# Patient Record
Sex: Female | Born: 1955
Health system: Southern US, Community
[De-identification: ages and names within clinical notes are randomized; demographics above are authoritative.]

## PROBLEM LIST (undated history)

## (undated) DIAGNOSIS — I10 Essential (primary) hypertension: Secondary | ICD-10-CM

## (undated) DIAGNOSIS — B019 Varicella without complication: Secondary | ICD-10-CM

## (undated) HISTORY — DX: Essential (primary) hypertension: I10

## (undated) HISTORY — PX: OTHER SURGICAL HISTORY: SHX169

## (undated) HISTORY — DX: Varicella without complication: B01.9

---

## 1997-11-01 ENCOUNTER — Other Ambulatory Visit: Admission: RE | Admit: 1997-11-01 | Discharge: 1997-11-01 | Payer: Self-pay | Admitting: Obstetrics and Gynecology

## 1997-11-09 ENCOUNTER — Observation Stay (HOSPITAL_COMMUNITY): Admission: RE | Admit: 1997-11-09 | Discharge: 1997-11-10 | Payer: Self-pay | Admitting: Obstetrics & Gynecology

## 1998-02-23 ENCOUNTER — Ambulatory Visit (HOSPITAL_COMMUNITY): Admission: RE | Admit: 1998-02-23 | Discharge: 1998-02-23 | Payer: Self-pay | Admitting: Obstetrics and Gynecology

## 1998-02-25 ENCOUNTER — Ambulatory Visit (HOSPITAL_COMMUNITY): Admission: RE | Admit: 1998-02-25 | Discharge: 1998-02-25 | Payer: Self-pay | Admitting: Obstetrics and Gynecology

## 1998-03-01 ENCOUNTER — Encounter: Payer: Self-pay | Admitting: Obstetrics

## 1998-03-02 ENCOUNTER — Inpatient Hospital Stay (HOSPITAL_COMMUNITY): Admission: AD | Admit: 1998-03-02 | Discharge: 1998-03-03 | Payer: Self-pay | Admitting: Obstetrics

## 1999-10-15 ENCOUNTER — Other Ambulatory Visit: Admission: RE | Admit: 1999-10-15 | Discharge: 1999-10-15 | Payer: Self-pay | Admitting: Obstetrics and Gynecology

## 1999-11-02 ENCOUNTER — Encounter: Payer: Self-pay | Admitting: Obstetrics and Gynecology

## 1999-11-02 ENCOUNTER — Ambulatory Visit (HOSPITAL_COMMUNITY): Admission: RE | Admit: 1999-11-02 | Discharge: 1999-11-02 | Payer: Self-pay | Admitting: Obstetrics and Gynecology

## 2000-11-24 ENCOUNTER — Other Ambulatory Visit: Admission: RE | Admit: 2000-11-24 | Discharge: 2000-11-24 | Payer: Self-pay | Admitting: Internal Medicine

## 2001-09-28 ENCOUNTER — Other Ambulatory Visit: Admission: RE | Admit: 2001-09-28 | Discharge: 2001-09-28 | Payer: Self-pay | Admitting: Obstetrics and Gynecology

## 2002-02-12 ENCOUNTER — Ambulatory Visit (HOSPITAL_COMMUNITY): Admission: RE | Admit: 2002-02-12 | Discharge: 2002-02-12 | Payer: Self-pay | Admitting: Obstetrics and Gynecology

## 2002-02-12 ENCOUNTER — Encounter: Payer: Self-pay | Admitting: Obstetrics and Gynecology

## 2002-03-02 ENCOUNTER — Other Ambulatory Visit: Admission: RE | Admit: 2002-03-02 | Discharge: 2002-03-02 | Payer: Self-pay | Admitting: Internal Medicine

## 2004-05-09 ENCOUNTER — Ambulatory Visit (HOSPITAL_COMMUNITY): Admission: RE | Admit: 2004-05-09 | Discharge: 2004-05-09 | Payer: Self-pay | Admitting: Obstetrics and Gynecology

## 2006-02-24 ENCOUNTER — Ambulatory Visit (HOSPITAL_COMMUNITY): Admission: RE | Admit: 2006-02-24 | Discharge: 2006-02-24 | Payer: Self-pay | Admitting: Obstetrics and Gynecology

## 2008-07-04 ENCOUNTER — Telehealth: Payer: Self-pay | Admitting: Internal Medicine

## 2008-08-26 ENCOUNTER — Ambulatory Visit (HOSPITAL_COMMUNITY): Admission: RE | Admit: 2008-08-26 | Discharge: 2008-08-26 | Payer: Self-pay | Admitting: Obstetrics and Gynecology

## 2008-09-01 ENCOUNTER — Encounter: Admission: RE | Admit: 2008-09-01 | Discharge: 2008-09-01 | Payer: Self-pay | Admitting: Obstetrics and Gynecology

## 2008-09-15 ENCOUNTER — Encounter: Payer: Self-pay | Admitting: Internal Medicine

## 2008-09-26 ENCOUNTER — Ambulatory Visit: Payer: Self-pay | Admitting: Internal Medicine

## 2008-09-26 DIAGNOSIS — R030 Elevated blood-pressure reading, without diagnosis of hypertension: Secondary | ICD-10-CM | POA: Insufficient documentation

## 2008-09-26 DIAGNOSIS — E669 Obesity, unspecified: Secondary | ICD-10-CM | POA: Insufficient documentation

## 2008-09-26 LAB — CONVERTED CEMR LAB: Pap Smear: NORMAL

## 2008-09-26 LAB — HM PAP SMEAR

## 2008-11-09 ENCOUNTER — Encounter (INDEPENDENT_AMBULATORY_CARE_PROVIDER_SITE_OTHER): Payer: Self-pay | Admitting: *Deleted

## 2009-06-13 LAB — HM COLONOSCOPY

## 2009-11-27 ENCOUNTER — Encounter: Admission: RE | Admit: 2009-11-27 | Discharge: 2009-11-27 | Payer: Self-pay | Admitting: Obstetrics and Gynecology

## 2009-11-27 LAB — HM MAMMOGRAPHY

## 2010-02-08 NOTE — Letter (Signed)
Summary: [facility]  [facility]   Imported By: Maryln Gottron 10/13/2008 15:25:38  _____________________________________________________________________  External Attachment:    Type:   Image     Comment:   External Document

## 2010-02-08 NOTE — Procedures (Signed)
Summary: [facility]  [facility] Provider: This provider was preselected by the workflow.  Signature: The signature status of this document was preset by the workflow  Processed by [employer] Local Indexer Client @ Friday, November 11, 2008 2:19:10 PM using version:2010.1.2.11(2.4)   Manually Indexed By: [id number]  idlBatchDetail: [id number]   _____________________________________________________________________  External Attachment:    Type:   Image     Comment:   External Document

## 2010-02-08 NOTE — Assessment & Plan Note (Signed)
Summary: NEW PATIENT/TO BE RESTABLISHED/CJR   Vital Signs:  Patient profile:   55 year old female Menstrual status:  irregular LMP:     09/10/2008 Height:      64.25 inches Weight:      187 pounds BMI:     31.96 Pulse rate:   72 / minute BP sitting:   120 / 80  (left arm) Cuff size:   regular  Vitals Entered By: Romualdo Bolk, CMA (AAMA) (September 26, 2008 1:57 PM) CC: Pt her to get re-establish- Pt wants to discuss going thru menopause. Pt is getting hot flashes that comes and goes. LMP (date): 09/10/2008 LMP - Character: light Menarche (age onset years): 19   Menses interval (days): 30 Menstrual flow (days): 5 Menstrual Status irregular Enter LMP: 09/10/2008 Last PAP Date 07/14/2008 Last PAP Result Normal   History of Present Illness: Mekisha Bittel comes in today for   for above. She is reestablishing with a PCP. Has some concerns about her health. Last visit here was 5-6 years ago. Her old record NA at time of visit .  She has mostly been followed by her GYne .  SHe is concerned about ability to maintain healthy weight.  Recently   placed on pheneteramine  for weight.   control.   and also doing   B 12 shots.  to help her diet regimen  per her GYNE office . Marland Kitchen  Bp had been up and now med  is decreased to 1/2 dose.     Blood  tests at Dr Trilby Drummer said it was normal.    To get a colonscopy in November .    Skipped July  period.   minimal    sred spotting.  No major sleep change .   Preventive Care Screening  Pap Smear:    Date:  09/26/2008    Results:  Normal   Preventive Screening-Counseling & Management  Alcohol-Tobacco     Alcohol drinks/day: 0     Smoking Status: quit     Year Quit: age 89's  Caffeine-Diet-Exercise     Caffeine use/day: 2     Does Patient Exercise: yes     Type of exercise: walking      Drug Use:  no.    Current Medications (verified): 1)  Cobal-1000 1000 Mcg/ml Soln (Cyanocobalamin) 2)  Phentermine Hcl 37.5 Mg Tabs (Phentermine Hcl)  .Marland Kitchen.. 1 By Mouth Once Daily  Allergies (verified): 1)  ! Penicillin  Past History:  Past Medical History: Unremarkable Chickenpox as a child  P4G1  tubal x 2   Past Surgical History: Miscarrages [phone] (tubal pg) Rt Tube and ovary  Remove  tubal pregnancy 1999 Bx Rt Breast Benign fibroadenoma  Past History:  Care Management: Gynecology: Bridget Hartshorn.   Gastroenterology: Loreta Ave  Family History: Father: DM Mother: Bone Cancer, HBP, DM Siblings: Sister- Gout, Brother-Healthy  Social History: Married self employed  Actuary degree Former Smoker Alcohol use-no Drug use-no Regular exercise-yes HH of 2  no pets  Smoking Status:  quit Caffeine use/day:  2 Does Patient Exercise:  yes Drug Use:  no  Review of Systems  The patient denies anorexia, fever, weight loss, vision loss, decreased hearing, hoarseness, chest pain, syncope, dyspnea on exertion, peripheral edema, prolonged cough, headaches, hemoptysis, abdominal pain, melena, hematochezia, severe indigestion/heartburn, hematuria, incontinence, muscle weakness, transient blindness, difficulty walking, depression, unusual weight change, abnormal bleeding, enlarged lymph nodes, angioedema, and breast masses.    Physical Exam  General:  Well-developed,well-nourished,in no acute distress; alert,appropriate and cooperative throughout examination Head:  Normocephalic and atraumatic without obvious abnormalities. No apparent alopecia or balding. Eyes:  PERRL, EOMs full, conjunctiva clear  Ears:  R ear normal, L ear normal, and no external deformities.   Nose:  no external deformity and no external erythema.   Mouth:  pharynx pink and moist.   Neck:  No deformities, masses, or tenderness noted. Lungs:  Normal respiratory effort, chest expands symmetrically. Lungs are clear to auscultation, no crackles or wheezes.no dullness.   Heart:  Normal rate and regular rhythm. S1 and S2 normal without gallop, murmur,  click, rub or other extra sounds.no lifts.   Abdomen:  Bowel sounds positive,abdomen soft and non-tender without masses, organomegaly or hernias noted. Msk:  normal ROM, no joint swelling, no joint warmth, and no redness over joints.   Pulses:  pulses intact without delay   Extremities:  No clubbing, cyanosis, edema, or deformity noted with normal full range of motion.  Neurologic:  alert & oriented X3, cranial nerves II-XII intact, strength normal in all extremities, gait normal, and DTRs symmetrical and normal.   Skin:  turgor normal, no suspicious lesions, no petechiae, no purpura, and no ulcerations.   Cervical Nodes:  No lymphadenopathy noted Psych:  Oriented X3, memory intact for recent and remote, normally interactive, good eye contact, not anxious appearing, and not depressed appearing.     Impression & Recommendations:  Problem # 1:  ? of MENOPAUSE, EARLY (ICD-627.2) WNL by hx .    Problem # 2:  OBESITY (ICD-278.00)  counseled   success rate with med alone is dismal .  reviewed confounding factors such as age sleep etc. Unclear why she is on B12 shots without a hx of b12 dificiency   by her report.   Ht: 64.25 (09/26/2008)   Wt: 187 (09/26/2008)   BMI: 31.96 (09/26/2008)  Problem # 3:  ELEVATED BLOOD PRESSURE WITHOUT DIAGNOSIS OF HYPERTENSION (ICD-796.2) seems to be from her diet medication. ok today .  needs to monitor .  Complete Medication List: 1)  Cobal-1000 1000 Mcg/ml Soln (Cyanocobalamin) 2)  Phentermine Hcl 37.5 Mg Tabs (Phentermine hcl) .Marland Kitchen.. 1 by mouth once daily  Other Orders: Tdap => 79yrs IM (24401) Admin 1st Vaccine (02725)  Patient Instructions: 1)  sign a release to get Labs from Dr Trilby Drummer office.  2)  Then we will decide on further testing. 3)  I think your signs are related to early menopause .   4)  Get at least 8 hours of sleep in a row at night .  5)  Dont skip meals . 6)  Get a Blood presssure  machine  for use over the years to check you Bp  readings  . below  140/90 is the goal and 120 /80 is better.  greater than 50% of visit spent in counseling  and further record review   45 minutes .  Immunizations Administered:  Tetanus Vaccine:    Vaccine Type: Tdap    Site: right deltoid    Mfr: Sanofi Pasteur    Dose: 0.5 ml    Route: IM    Given by: Romualdo Bolk, CMA (AAMA)    Exp. Date: 07/14/2010    Lot #: [id number]   Appended Document: NEW PATIENT/TO BE RESTABLISHED/CJR Tell patient that the only  lab report  i have recieved from Dr Loreta Ave is normal .    However would like her  to have  fasting lipids, tsh with free  t4  , vitamin d  and vit b12 level done if not done elsewhere.then return office visit to discuss results .   Appended Document: NEW PATIENT/TO BE RESTABLISHED/CJR Pt aware and will call back to schedule lab work

## 2010-02-08 NOTE — Progress Notes (Signed)
Summary: Pt called and said she is Dr. Fabian Sharp pt. Chart must be archived.  Phone Note Call from Patient Call back at Home Phone [phone]   Caller: Patient Call For: Morgan Maldonado Summary of Call: Patient called and wanted to set up an appt with Dr. Fabian Sharp, but according to IDX and EMR she hasnt had any appts. I told patient that she would have to be sceduled as a new patient. The first avail new patient appt is 10/04/08 at 10am, so I scheduled her for then. Patient says she knows it hasnt been that long since she has seen Dr. Fabian Sharp and would like to get in for an earlier appt. Pt mentioned she would like to have a physical. Please advise.  Initial call taken by: Lucy Antigua,  July 04, 2008 1:11 PM  Follow-up for Phone Call        Last seen for a cpx according to Medical Manager was 03/02/2002. Follow-up by: Romualdo Bolk, CMA,  July 04, 2008 1:51 PM  Additional Follow-up for Phone Call Additional follow up Details #1::        I called patient and told her, since she was last seen in 2004, that she would have to come in for new pt appt. She is sceduled for September. Pt has been notified.  Additional Follow-up by: Lucy Antigua,  July 05, 2008 12:19 PM

## 2011-02-18 ENCOUNTER — Other Ambulatory Visit: Payer: Self-pay | Admitting: Obstetrics and Gynecology

## 2011-02-18 DIAGNOSIS — Z1231 Encounter for screening mammogram for malignant neoplasm of breast: Secondary | ICD-10-CM

## 2011-02-25 ENCOUNTER — Ambulatory Visit
Admission: RE | Admit: 2011-02-25 | Discharge: 2011-02-25 | Disposition: A | Discharge status: Home / Self Care | Payer: 59 | Source: Ambulatory Visit | Attending: Obstetrics and Gynecology | Admitting: Obstetrics and Gynecology

## 2011-02-25 DIAGNOSIS — Z1231 Encounter for screening mammogram for malignant neoplasm of breast: Secondary | ICD-10-CM

## 2011-06-13 ENCOUNTER — Encounter: Payer: Self-pay | Admitting: Internal Medicine

## 2011-06-14 ENCOUNTER — Encounter: Payer: Self-pay | Admitting: Internal Medicine

## 2011-06-14 ENCOUNTER — Ambulatory Visit (INDEPENDENT_AMBULATORY_CARE_PROVIDER_SITE_OTHER): Payer: 59 | Admitting: Internal Medicine

## 2011-06-14 VITALS — BP 164/92 | HR 75 | Temp 97.6°F | Ht 63.75 in | Wt 190.0 lb

## 2011-06-14 DIAGNOSIS — Z0000 Encounter for general adult medical examination without abnormal findings: Secondary | ICD-10-CM

## 2011-06-14 DIAGNOSIS — E669 Obesity, unspecified: Secondary | ICD-10-CM

## 2011-06-14 DIAGNOSIS — I10 Essential (primary) hypertension: Secondary | ICD-10-CM

## 2011-06-14 LAB — CBC WITH DIFFERENTIAL/PLATELET
Basophils Absolute: 0 10*3/uL (ref 0.0–0.1)
Basophils Relative: 0.6 % (ref 0.0–3.0)
Eosinophils Absolute: 0.1 10*3/uL (ref 0.0–0.7)
Eosinophils Relative: 1.4 % (ref 0.0–5.0)
HCT: 38.5 % (ref 36.0–46.0)
Hemoglobin: 12.6 g/dL (ref 12.0–15.0)
Lymphocytes Relative: 51.1 % — ABNORMAL HIGH (ref 12.0–46.0)
Lymphs Abs: 2.1 10*3/uL (ref 0.7–4.0)
MCHC: 32.8 g/dL (ref 30.0–36.0)
MCV: 79.1 fl (ref 78.0–100.0)
Monocytes Absolute: 0.5 10*3/uL (ref 0.1–1.0)
Monocytes Relative: 10.9 % (ref 3.0–12.0)
Neutro Abs: 1.5 10*3/uL (ref 1.4–7.7)
Neutrophils Relative %: 36 % — ABNORMAL LOW (ref 43.0–77.0)
Platelets: 232 10*3/uL (ref 150.0–400.0)
RBC: 4.86 Mil/uL (ref 3.87–5.11)
RDW: 13.1 % (ref 11.5–14.6)
WBC: 4.2 10*3/uL — ABNORMAL LOW (ref 4.5–10.5)

## 2011-06-14 LAB — LDL CHOLESTEROL, DIRECT: Direct LDL: 122.7 mg/dL

## 2011-06-14 LAB — POCT URINALYSIS DIPSTICK
Bilirubin, UA: NEGATIVE
Blood, UA: NEGATIVE
Glucose, UA: NEGATIVE
Ketones, UA: NEGATIVE
Leukocytes, UA: NEGATIVE
Nitrite, UA: NEGATIVE
Protein, UA: NEGATIVE
Spec Grav, UA: <=1.005
Urobilinogen, UA: 0.2
pH, UA: 6.5

## 2011-06-14 LAB — LIPID PANEL
Cholesterol: 209 mg/dL — ABNORMAL HIGH (ref 0–200)
HDL: 43.5 mg/dL (ref >39.00)
Total CHOL/HDL Ratio: 5
Triglycerides: 164 mg/dL — ABNORMAL HIGH (ref 0.0–149.0)
VLDL: 32.8 mg/dL (ref 0.0–40.0)

## 2011-06-14 LAB — HEPATIC FUNCTION PANEL
ALT: 23 U/L (ref 0–35)
AST: 19 U/L (ref 0–37)
Albumin: 3.9 g/dL (ref 3.5–5.2)
Alkaline Phosphatase: 99 U/L (ref 39–117)
Bilirubin, Direct: 0 mg/dL (ref 0.0–0.3)
Total Bilirubin: 0.2 mg/dL — ABNORMAL LOW (ref 0.3–1.2)
Total Protein: 7.7 g/dL (ref 6.0–8.3)

## 2011-06-14 LAB — BASIC METABOLIC PANEL
BUN: 11 mg/dL (ref 6–23)
CO2: 29 mEq/L (ref 19–32)
Calcium: 9.1 mg/dL (ref 8.4–10.5)
Chloride: 105 mEq/L (ref 96–112)
Creatinine, Ser: 0.8 mg/dL (ref 0.4–1.2)
GFR: 98.83 mL/min (ref >60.00)
Glucose, Bld: 81 mg/dL (ref 70–99)
Potassium: 4.3 mEq/L (ref 3.5–5.1)
Sodium: 139 mEq/L (ref 135–145)

## 2011-06-14 LAB — TSH: TSH: 1.75 u[IU]/mL (ref 0.35–5.50)

## 2011-06-14 MED ORDER — AMLODIPINE BESY-BENAZEPRIL HCL 5-10 MG PO CAPS: 1.0000 | ORAL_CAPSULE | Freq: Every day | ORAL | Status: DC

## 2011-06-14 NOTE — Progress Notes (Signed)
Subjective:    Patient ID: Morgan Maldonado, female    DOB: Feb 28, 1955, 56 y.o.   MRN: [id number]  HPI Patient comes in today for preventive visit and follow-up of medical issues. Update  history since  last visit: almost 3 years ago : no injury or major changes  BP readings at her ob gyne "a bit high ." no cp sob sleep irreg ? Menopause .  Hard to lose weight Family hx  No HT?  Sleep 4-6 hours    Colon 2011 Vision reading glasses.  She is on no meds now Review of Systems ROS:  GEN/ HEENT: No fever, significant weight changes sweats headaches vision problems hearing changes, CV/ PULM; No chest pain shortness of breath cough, syncope,edema  change in exercise tolerance. GI /GU: No adominal pain, vomiting, change in bowel habits. No blood in the stool. No significant GU symptoms. SKIN/HEME: ,no acute skin rashes suspicious lesions or bleeding. No lymphadenopathy, nodules, masses.  NEURO/ PSYCH:  No neurologic signs such as weakness numbness. No depression anxiety. IMM/ Allergy: No unusual infections.  Allergy .   REST of 12 system review negative except as per HPI knees bother her sometimes   Past history family history social history reviewed in the electronic medical record. On no meds      Objective:   Physical Exam BP 164/92  Pulse 75  Temp(Src) 97.6 F (36.4 C) (Oral)  Ht 5' 3.75" (1.619 m)  Wt 190 lb (86.183 kg)  BMI 32.87 kg/m2  SpO2 96%  Wt Readings from Last 3 Encounters:  06/14/11 190 lb (86.183 kg)  09/26/08 187 lb (84.823 kg)  Physical Exam: Vital signs reviewed repeat BP readings 158/98 range right and left  Reg cuff  JYN:WGNF is a well-developed well-nourished alert cooperative  AA female who appears her stated age in no acute distress.  HEENT: normocephalic atraumatic , Eyes: PERRL EOM's full, conjunctiva clear, Nares: paten,t no deformity discharge or tenderness., Ears: no deformity EAC's clear TMs with normal landmarks. Mouth: clear OP, no lesions, edema.  Moist  mucous membranes. Dentition in adequate repair. NECK: supple without masses, thyromegaly or bruits. CHEST/PULM:  Clear to auscultation and percussion breath sounds equal no wheeze , rales or rhonchi. No chest wall deformities or tenderness. Breast: normal by inspection . No dimpling, discharge, masses, tenderness or discharge . CV: PMI is nondisplaced, S1 S2 no gallops, murmurs, rubs. Peripheral pulses are full without delay.No JVD .  ABDOMEN: Bowel sounds normal nontender  No guard or rebound, no hepato splenomegal no CVA tenderness.  No hernia. Extremtities:  No clubbing cyanosis or edema, no acute joint swelling or redness no focal atrophy NEURO:  Oriented x3, cranial nerves 3-12 appear to be intact, no obvious focal weakness,gait within normal limits no abnormal reflexes or asymmetrical SKIN: No acute rashes normal turgor, color, no bruising or petechiae. PSYCH: Oriented, good eye contact, no obvious depression anxiety, cognition and judgment appear normal. LN: no cervical axillary inguinal adenopathy     Assessment & Plan:  Preventive Health Care Counseled regarding healthy nutrition, exercise, sleep, injury prevention, calcium vit d and healthy weight . Lab today utd on colon  2011  Get dentist and eye check   Sees gyne for pap. UTD   Elevated Bp probable hypertension.  Get machine and monitor  If elevated x 3 begin medication  lotrel 5 10 .   Disc risk benenfit  l.  Fu in 1-2 months  labd pending today  ife style interventions  Counseled.  Obesity   Counseled. Monitoring and inc sleep

## 2011-06-14 NOTE — Patient Instructions (Addendum)
Your blood pressure is elevated \\get  a machine  Implement  lifestyle intervention healthy eating and exercise . Weight loss low salt and sodium DASH diet. If your readings at home are elevated x 3 then begin medication to treat in the meantime. ROV in 1-2 months with machine . And readings of bp. Calendar your sleep intake to help with weight control.   Hypertension Information As your heart beats, it forces blood through your arteries. This force is your blood pressure. If the pressure is too high, it is called hypertension (HTN) or high blood pressure. HTN is dangerous because you may have it and not know it. High blood pressure may mean that your heart has to work harder to pump blood. Your arteries may be narrow or stiff. The extra work puts you at risk for heart disease, stroke, and other problems.  Blood pressure consists of two numbers, a higher number over a lower, 110/72, for example. It is stated as "110 over 72." The ideal is below 120 for the top number (systolic) and under 80 for the bottom (diastolic).  You should pay close attention to your blood pressure if you have certain conditions such as:  Heart failure.   Prior heart attack.   Diabetes   Chronic kidney disease.   Prior stroke.   Multiple risk factors for heart disease.  To see if you have HTN, your blood pressure should be measured while you are seated with your arm held at the level of the heart. It should be measured at least twice. A one-time elevated blood pressure reading (especially in the Emergency Department) does not mean that you need treatment. There may be conditions in which the blood pressure is different between your right and left arms. It is important to see your caregiver soon for a recheck. Most people have essential hypertension which means that there is not a specific cause. This type of high blood pressure may be lowered by changing lifestyle factors such as:  Stress.   Smoking.   Lack of  exercise.   Excessive weight.   Drug/tobacco/alcohol use.   Eating less salt.  Most people do not have symptoms from high blood pressure until it has caused damage to the body. Effective treatment can often prevent, delay or reduce that damage. TREATMENT  Treatment for high blood pressure, when a cause has been identified, is directed at the cause. There are a large number of medications to treat HTN. These fall into several categories, and your caregiver will help you select the medicines that are best for you. Medications may have side effects. You should review side effects with your caregiver. If your blood pressure stays high after you have made lifestyle changes or started on medicines,   Your medication(s) may need to be changed.   Other problems may need to be addressed.   Be certain you understand your prescriptions, and know how and when to take your medicine.   Be sure to follow up with your caregiver within the time frame advised (usually within two weeks) to have your blood pressure rechecked and to review your medications.   If you are taking more than one medicine to lower your blood pressure, make sure you know how and at what times they should be taken. Taking two medicines at the same time can result in blood pressure that is too low.  Document Released: 02/26/2005 Document Revised: 09/05/2010 Document Reviewed: 03/05/2007 [facility] Patient Information 2012 [place], [place].DASH Diet The DASH diet stands for "  Dietary Approaches to Stop Hypertension." It is a healthy eating plan that has been shown to reduce high blood pressure (hypertension) in as little as 14 days, while also possibly providing other significant health benefits. These other health benefits include reducing the risk of breast cancer after menopause and reducing the risk of type 2 diabetes, heart disease, colon cancer, and stroke. Health benefits also include weight loss and slowing kidney failure in patients  with chronic kidney disease.  DIET GUIDELINES  Limit salt (sodium). Your diet should contain less than 1500 mg of sodium daily.   Limit refined or processed carbohydrates. Your diet should include mostly whole grains. Desserts and added sugars should be used sparingly.   Include small amounts of heart-healthy fats. These types of fats include nuts, oils, and tub margarine. Limit saturated and trans fats. These fats have been shown to be harmful in the body.  CHOOSING FOODS  The following food groups are based on a 2000 calorie diet. See your Registered Dietitian for individual calorie needs. Grains and Grain Products (6 to 8 servings daily)  Eat More Often: Whole-wheat bread, [place] rice, whole-grain or wheat pasta, quinoa, popcorn without added fat or salt (air popped).   Eat Less Often: White bread, white pasta, white rice, cornbread.  Vegetables (4 to 5 servings daily)  Eat More Often: Fresh, frozen, and canned vegetables. Vegetables may be raw, steamed, roasted, or grilled with a minimal amount of fat.   Eat Less Often/Avoid: Creamed or fried vegetables. Vegetables in a cheese sauce.  Fruit (4 to 5 servings daily)  Eat More Often: All fresh, canned (in natural juice), or frozen fruits. Dried fruits without added sugar. One hundred percent fruit juice ( cup [237 mL] daily).   Eat Less Often: Dried fruits with added sugar. Canned fruit in light or heavy syrup.  [employer], Fish, and Poultry (2 servings or less daily. One serving is 3 to 4 oz [85-114 g]).  Eat More Often: Ninety percent or leaner ground beef, tenderloin, sirloin. Round cuts of beef, chicken breast, [place] breast. All fish. Grill, bake, or broil your meat. Nothing should be fried.   Eat Less Often/Avoid: Fatty cuts of meat, [place], or chicken leg, thigh, or wing. Fried cuts of meat or fish.  Dairy (2 to 3 servings)  Eat More Often: Low-fat or fat-free milk, low-fat plain or light yogurt, reduced-fat or part-skim  cheese.   Eat Less Often/Avoid: Milk (whole, 2%, skim, or chocolate).Whole milk yogurt. Full-fat cheeses.  Nuts, Seeds, and Legumes (4 to 5 servings per week)  Eat More Often: All without added salt.   Eat Less Often/Avoid: Salted nuts and seeds, canned beans with added salt.  Fats and Sweets (limited)  Eat More Often: Vegetable oils, tub margarines without trans fats, sugar-free gelatin. Mayonnaise and salad dressings.   Eat Less Often/Avoid: Coconut oils, palm oils, butter, stick margarine, cream, half and half, cookies, candy, pie.  FOR MORE INFORMATION The Dash Diet Eating Plan: www.dashdiet.org Document Released: 12/13/2010 Document Reviewed: 12/03/2010 [facility] Patient Information 2012 [place], [place].

## 2011-08-14 ENCOUNTER — Ambulatory Visit: Payer: 59 | Admitting: Internal Medicine

## 2011-09-06 ENCOUNTER — Ambulatory Visit (INDEPENDENT_AMBULATORY_CARE_PROVIDER_SITE_OTHER): Payer: 59 | Admitting: Internal Medicine

## 2011-09-06 ENCOUNTER — Encounter: Payer: Self-pay | Admitting: Internal Medicine

## 2011-09-06 VITALS — BP 140/72 | HR 81 | Temp 98.1°F | Wt 174.0 lb

## 2011-09-06 DIAGNOSIS — Z9119 Patient's noncompliance with other medical treatment and regimen: Secondary | ICD-10-CM

## 2011-09-06 DIAGNOSIS — Z91199 Patient's noncompliance with other medical treatment and regimen due to unspecified reason: Secondary | ICD-10-CM

## 2011-09-06 DIAGNOSIS — T887XXA Unspecified adverse effect of drug or medicament, initial encounter: Secondary | ICD-10-CM

## 2011-09-06 DIAGNOSIS — R030 Elevated blood-pressure reading, without diagnosis of hypertension: Secondary | ICD-10-CM

## 2011-09-06 DIAGNOSIS — E669 Obesity, unspecified: Secondary | ICD-10-CM

## 2011-09-06 DIAGNOSIS — I10 Essential (primary) hypertension: Secondary | ICD-10-CM

## 2011-09-06 MED ORDER — LISINOPRIL 10 MG PO TABS: 10.0000 mg | ORAL_TABLET | Freq: Every day | ORAL | Status: DC

## 2011-09-06 NOTE — Patient Instructions (Signed)
Check your readings 3 x per week.   After sitting relaxing for 5 minutes or so.      Below 140/90 would.  Be normal.     Different  Low dose medication   Begin in  Machine to next visit.

## 2011-09-06 NOTE — Progress Notes (Signed)
  Subjective:    Patient ID: Morgan Maldonado, female    DOB: [birth year], 56 y.o.   MRN: [id number]  HPI Patient comes in today for follow up of  aeevator blood pressure See note may 13  she is late coming back in for followup. She did get the medication filled and took it for about a week and felt nauseous so stopped it. She has trying to do LSI and is losing weight. Got a blood pressure monitor  But it is still in the boxand hasnt checked  her own readings yet.   Still having a hard time accepting that she could have an elevated blood pressure. No new chest pain shortness of breath. Review of Systems  As per hpi  Outpatient Encounter Prescriptions as of 09/06/2011  Medication Sig Dispense Refill  . amLODipine-benazepril (LOTREL) 5-10 MG per capsule Take 1 capsule by mouth daily.  30 capsule  3       Objective:   Physical Exam BP 140/72  Pulse 81  Temp 98.1 F (36.7 C) (Oral)  Wt 174 lb (78.926 kg)  SpO2 98% Wt Readings from Last 3 Encounters:  09/06/11 174 lb (78.926 kg)  06/14/11 190 lb (86.183 kg)  09/26/08 187 lb (84.823 kg)   WDWN in nad  Repeat Bp reading  150/92 140/86 left  144/90  Looks well   Some anxiety here  Lab Results  Component Value Date   WBC 4.2* 06/14/2011   HGB 12.6 06/14/2011   HCT 38.5 06/14/2011   PLT 232.0 06/14/2011   GLUCOSE 81 06/14/2011   CHOL 209* 06/14/2011   TRIG 164.0* 06/14/2011   HDL 43.50 06/14/2011   LDLDIRECT 122.7 06/14/2011   ALT 23 06/14/2011   AST 19 06/14/2011   NA 139 06/14/2011   K 4.3 06/14/2011   CL 105 06/14/2011   CREATININE 0.8 06/14/2011   BUN 11 06/14/2011   CO2 29 06/14/2011   TSH 1.75 06/14/2011       Assessment & Plan:  Hypertension   Poss some white coat effect. Obesity : Improved and helping  Readings but not enougj data to decide on next step  Will go slow   Weight loss has helped but  hasnt followed throught with documentation of readings  .  Took med for a week and had nausea  And thus stopped .  Currently prob doesn't need as much med.  One to avoid significant side effects but not count her blood pressure readings  hypertension. rx given for 10 lisinopril  If needed 5- 10 mg  To begin  If up. But really needs to get readings and  Fu.  Continue healthy lifestyle intervention  For  Her in the meantime.   Total visit > 50% spent counseling and coordinating care

## 2011-10-18 ENCOUNTER — Ambulatory Visit (INDEPENDENT_AMBULATORY_CARE_PROVIDER_SITE_OTHER): Payer: 59 | Admitting: Internal Medicine

## 2011-10-18 ENCOUNTER — Encounter: Payer: Self-pay | Admitting: Internal Medicine

## 2011-10-18 VITALS — BP 128/74 | HR 77 | Temp 97.9°F | Wt 186.0 lb

## 2011-10-18 DIAGNOSIS — R059 Cough, unspecified: Secondary | ICD-10-CM

## 2011-10-18 DIAGNOSIS — Z23 Encounter for immunization: Secondary | ICD-10-CM

## 2011-10-18 DIAGNOSIS — E669 Obesity, unspecified: Secondary | ICD-10-CM

## 2011-10-18 DIAGNOSIS — R058 Other specified cough: Secondary | ICD-10-CM | POA: Insufficient documentation

## 2011-10-18 DIAGNOSIS — T464X5A Adverse effect of angiotensin-converting-enzyme inhibitors, initial encounter: Secondary | ICD-10-CM | POA: Insufficient documentation

## 2011-10-18 DIAGNOSIS — I10 Essential (primary) hypertension: Secondary | ICD-10-CM

## 2011-10-18 DIAGNOSIS — T44905A Adverse effect of unspecified drugs primarily affecting the autonomic nervous system, initial encounter: Secondary | ICD-10-CM

## 2011-10-18 DIAGNOSIS — R05 Cough: Secondary | ICD-10-CM

## 2011-10-18 MED ORDER — CHLORTHALIDONE 25 MG PO TABS: 25.0000 mg | ORAL_TABLET | Freq: Every day | ORAL | Status: DC

## 2011-10-18 NOTE — Progress Notes (Signed)
  Subjective:    Patient ID: Morgan Maldonado, female    DOB: [birth year], 56 y.o.   MRN: [id number]  HPI Patient comes in today for follow up of  medical problems. In regard to BP.  Taking med   Since last dose   Has mild dry cough . Blood pressure readings are in the 120-125/60-70 range.  She has developed a mild nagging dry cough more at night. No chest pain or wheezing. Is trying to keep better but is adding dark chocolate 2 candy bars a week is disappointed that she has gained weight. She tends TE Poss the rice and bread a little too much  Review of Systems Negative for fever weight loss. Past history family history social history reviewed in the electronic medical record.    Objective:   Physical Exam BP 128/74  Pulse 77  Temp 97.9 F (36.6 C) (Oral)  Wt 186 lb (84.369 kg)  SpO2 98%  Wt Readings from Last 3 Encounters:  10/18/11 186 lb (84.369 kg)  09/06/11 174 lb (78.926 kg)  06/14/11 190 lb (86.183 kg)  wdwn in nad  BP readings her machine  146/80  Repeat out cuff 140/80 sitting   Looks well .     Assessment & Plan:  HT good response to med but has mild cough prob acei cough  By hx .  Options discussed. Stop  ACE inhibitor prefers to try a diuretic over ace receptor blocker. At this time. Weight up instead of down.    Intensify lifestyle interventions. tracking intake use weight watcher techniques. Begin chl;orthalidone 25 and check readigns 3 x per week then rov in 2 months check bmp then if continuing. Warned about risk of hypokalemia  Disc flu vaccine given today .   Total visit > 50% spent counseling and coordinating care

## 2011-10-18 NOTE — Patient Instructions (Addendum)
Cough could be from acei   Track  intake. To help with weight loss.   Can start the diuretic and then fu in  2 months .

## 2011-12-18 ENCOUNTER — Ambulatory Visit: Payer: 59 | Admitting: Internal Medicine

## 2011-12-19 ENCOUNTER — Ambulatory Visit: Payer: 59 | Admitting: Internal Medicine

## 2012-01-13 ENCOUNTER — Ambulatory Visit: Payer: 59 | Admitting: Internal Medicine

## 2012-02-04 ENCOUNTER — Ambulatory Visit: Payer: 59 | Admitting: Internal Medicine

## 2012-02-24 ENCOUNTER — Encounter: Payer: Self-pay | Admitting: Internal Medicine

## 2012-02-24 ENCOUNTER — Encounter: Payer: 59 | Admitting: Internal Medicine

## 2012-02-24 NOTE — Progress Notes (Signed)
Pt canceled day of appt  Opened for chart review

## 2012-03-18 ENCOUNTER — Ambulatory Visit: Payer: 59 | Admitting: Internal Medicine

## 2012-03-18 NOTE — Progress Notes (Signed)
Opened in previee for ov   NO SHOW?

## 2012-05-11 ENCOUNTER — Ambulatory Visit (INDEPENDENT_AMBULATORY_CARE_PROVIDER_SITE_OTHER): Payer: 59 | Admitting: Physician Assistant

## 2012-05-11 VITALS — BP 140/80 | HR 86 | Temp 98.5°F | Resp 16 | Ht 63.5 in | Wt 186.0 lb

## 2012-05-11 DIAGNOSIS — R059 Cough, unspecified: Secondary | ICD-10-CM

## 2012-05-11 DIAGNOSIS — R05 Cough: Secondary | ICD-10-CM

## 2012-05-11 MED ORDER — IPRATROPIUM BROMIDE 0.03 % NA SOLN: 2.0000 | Freq: Two times a day (BID) | NASAL | Status: DC

## 2012-05-11 MED ORDER — BENZONATATE 100 MG PO CAPS: 100.0000 mg | ORAL_CAPSULE | Freq: Three times a day (TID) | ORAL | Status: DC | PRN

## 2012-05-11 MED ORDER — HYDROCOD POLST-CHLORPHEN POLST 10-8 MG/5ML PO LQCR: 5.0000 mL | Freq: Two times a day (BID) | ORAL | Status: DC | PRN

## 2012-05-11 NOTE — Progress Notes (Signed)
Subjective:    Patient ID: Morgan Maldonado, female    DOB: 26-May-1955, 57 y.o.   MRN: [id number]  HPI This 57 y.o. female presents for evaluation of cough x 2.5 days.  It is keeping her awake at night.  Non-productive. Some chills on the first day of symptoms.  Ear fullness/itching.  No nasal/sinus pressure/congestion or post-nasal drainage.  No sore throat.  No GI symptoms.  No unexplained myalgias/arthralgias/rash.   Past Medical History  Diagnosis Date  . Chicken pox     as a child  . Hypertension     Past Surgical History  Procedure Laterality Date  . Miscarriages      [phone] tubal pg  . Right tube and ovary      remove tubal pregnancy 1999  . Bx right breast benign fibroadenoma      Prior to Admission medications   Medication Sig Start Date End Date Taking? Authorizing Provider  chlorthalidone (HYGROTON) 25 MG tablet Take 1 tablet (25 mg total) by mouth daily. 10/18/11  Yes Madelin Headings, MD  benzonatate (TESSALON) 100 MG capsule Take 1-2 capsules (100-200 mg total) by mouth 3 (three) times daily as needed for cough. 05/11/12   Elidia Bonenfant S Cason Dabney, PA-C  ipratropium (ATROVENT) 0.03 % nasal spray Place 2 sprays into the nose 2 (two) times daily. 05/11/12   Jaedynn Bohlken Tessa Lerner, PA-C    Allergies  Allergen Reactions  . Codeine Nausea And Vomiting  . Hydrocodone Nausea And Vomiting  . Penicillins   . Lisinopril Cough    Dry cough  With med.   . Lotrel (Amlodipine Besy-Benazepril Hcl) Nausea Only    Took for one week.    History   Social History  . Marital Status: Married    Spouse Name: N/A    Number of Children: 1  . Years of Education: 15   Occupational History  . Broker    Social History Main Topics  . Smoking status: Former Smoker -- 9 years    Types: Cigarettes  . Alcohol Use: No  . Drug Use: No   Social History Narrative   Married self employed real estate   About  50 minutes.   College degree   hh of 2   No pets              Family History  Problem  Relation Age of Onset  . Cancer Mother   . Hypertension Mother   . Diabetes Mother   . Diabetes Father   . Gout Sister   . Healthy Brother    Review of Systems As above.    Objective:   Physical Exam Blood pressure 140/80, pulse 86, temperature 98.5 F (36.9 C), temperature source Oral, resp. rate 16, height 5' 3.5" (1.613 m), weight 186 lb (84.369 kg), SpO2 98.00%. Body mass index is 32.43 kg/(m^2). Well-developed, well nourished BF who is awake, alert and oriented, in NAD. HEENT: [place]/AT, PERRL, EOMI.  Sclera and conjunctiva are clear.  EAC are patent, TMs are normal in appearance. Nasal mucosa is pink and moist. OP is clear. Neck: supple, non-tender, no lymphadenopathy, thyromegaly. Heart: RRR, no murmur Lungs: normal effort, CTA Extremities: no cyanosis, clubbing or edema. Skin: warm and dry without rash. Psychologic: good mood and appropriate affect, normal speech and behavior.     Assessment & Plan:  Cough - Plan: ipratropium (ATROVENT) 0.03 % nasal spray, benzonatate (TESSALON) 100 MG capsule, DISCONTINUED: chlorpheniramine-HYDROcodone (TUSSIONEX PENNKINETIC ER) 10-8 MG/5ML LQCR (allergy list updated to include hydrocodone,  which patient states causes nausea/vomiting).  Patient Instructions  Get plenty of rest and drink at least 64 ounces of water daily.    Fernande Bras, PA-C Physician Assistant-Certified [facility]

## 2012-05-11 NOTE — Patient Instructions (Signed)
Get plenty of rest and drink at least 64 ounces of water daily. 

## 2018-01-08 ENCOUNTER — Ambulatory Visit (INDEPENDENT_AMBULATORY_CARE_PROVIDER_SITE_OTHER): Payer: 59 | Admitting: Internal Medicine

## 2018-01-08 VITALS — BP 152/82 | HR 83 | Temp 98.5°F | Ht 63.5 in | Wt 199.9 lb

## 2018-01-08 DIAGNOSIS — Z79899 Other long term (current) drug therapy: Secondary | ICD-10-CM

## 2018-01-08 DIAGNOSIS — Z7689 Persons encountering health services in other specified circumstances: Secondary | ICD-10-CM | POA: Diagnosis not present

## 2018-01-08 DIAGNOSIS — Z6834 Body mass index (BMI) 34.0-34.9, adult: Secondary | ICD-10-CM

## 2018-01-08 DIAGNOSIS — I10 Essential (primary) hypertension: Secondary | ICD-10-CM

## 2018-01-08 DIAGNOSIS — Z23 Encounter for immunization: Secondary | ICD-10-CM | POA: Diagnosis not present

## 2018-01-08 LAB — CBC WITH DIFFERENTIAL/PLATELET
Basophils Absolute: 0 10*3/uL (ref 0.0–0.1)
Basophils Relative: 0.6 % (ref 0.0–3.0)
Eosinophils Absolute: 0.1 10*3/uL (ref 0.0–0.7)
Eosinophils Relative: 1.6 % (ref 0.0–5.0)
HCT: 40.1 % (ref 36.0–46.0)
Hemoglobin: 13 g/dL (ref 12.0–15.0)
Lymphocytes Relative: 41.4 % (ref 12.0–46.0)
Lymphs Abs: 2.5 10*3/uL (ref 0.7–4.0)
MCHC: 32.3 g/dL (ref 30.0–36.0)
MCV: 79.2 fl (ref 78.0–100.0)
Monocytes Absolute: 0.6 10*3/uL (ref 0.1–1.0)
Monocytes Relative: 9.8 % (ref 3.0–12.0)
Neutro Abs: 2.9 10*3/uL (ref 1.4–7.7)
Neutrophils Relative %: 46.6 % (ref 43.0–77.0)
Platelets: 266 10*3/uL (ref 150.0–400.0)
RBC: 5.07 Mil/uL (ref 3.87–5.11)
RDW: 13.6 % (ref 11.5–15.5)
WBC: 6.1 10*3/uL (ref 4.0–10.5)

## 2018-01-08 LAB — LIPID PANEL
Cholesterol: 216 mg/dL — ABNORMAL HIGH (ref 0–200)
HDL: 42.1 mg/dL (ref >39.00)
LDL Cholesterol: 146 mg/dL — ABNORMAL HIGH (ref 0–99)
NonHDL: 173.81
Total CHOL/HDL Ratio: 5
Triglycerides: 140 mg/dL (ref 0.0–149.0)
VLDL: 28 mg/dL (ref 0.0–40.0)

## 2018-01-08 LAB — BASIC METABOLIC PANEL
BUN: 12 mg/dL (ref 6–23)
CO2: 29 mEq/L (ref 19–32)
Calcium: 9.1 mg/dL (ref 8.4–10.5)
Chloride: 103 mEq/L (ref 96–112)
Creatinine, Ser: 0.94 mg/dL (ref 0.40–1.20)
GFR: 77.86 mL/min (ref >60.00)
Glucose, Bld: 96 mg/dL (ref 70–99)
Potassium: 4.2 mEq/L (ref 3.5–5.1)
Sodium: 139 mEq/L (ref 135–145)

## 2018-01-08 LAB — HEPATIC FUNCTION PANEL
ALT: 20 U/L (ref 0–35)
AST: 19 U/L (ref 0–37)
Albumin: 4.2 g/dL (ref 3.5–5.2)
Alkaline Phosphatase: 97 U/L (ref 39–117)
Bilirubin, Direct: 0.1 mg/dL (ref 0.0–0.3)
Total Bilirubin: 0.3 mg/dL (ref 0.2–1.2)
Total Protein: 7.7 g/dL (ref 6.0–8.3)

## 2018-01-08 LAB — TSH: TSH: 2.96 u[IU]/mL (ref 0.35–4.50)

## 2018-01-08 MED ORDER — TRIAMTERENE-HCTZ 37.5-25 MG PO TABS: 1.0000 | ORAL_TABLET | Freq: Every day | ORAL | 1 refills | Status: DC

## 2018-01-08 NOTE — Progress Notes (Signed)
Chief Complaint  Patient presents with  . Establish Care    Pt is concern about weight     HPI: Patient  Morgan Maldonado  63 y.o. comes in today for   Reestablishing and to get checked out. Sees GYNE yearly   Last visit was  10 13 with me.   hasn't been on bp med  And ocass reading bp. BP some what high :     130/80  Caregiver for mom  66 natural causes .  In 2017 and    Multip issues  So hasnt been in for PV  Has put on weight over the years  Dr cousins   Does  mammo  And  Gyne checks  No  Blood work any time recently  [employer]   Vit d   Health Maintenance  Topic Date Due  . Hepatitis C Screening  06-12-1955  . HIV Screening  01/21/1972  . PAP SMEAR-Modifier  09/27/2011  . MAMMOGRAM  02/24/2013  . TETANUS/TDAP  09/27/2018  . COLONOSCOPY  06/14/2019  . INFLUENZA VACCINE  Completed   Health Maintenance Review LIFESTYLE:  Exercise:  Walking 2 days per week.  Tobacco/ETS: no Alcohol:  no Sugar beverages: Sleep: 6  Drug use: no HH of  2  No pet  Work:  [employer]ou [employer]      ROS:  GEN/ HEENT: No fever, significant weight changes sweats headaches vision problems hearing changes, CV/ PULM; No chest pain shortness of breath cough, syncope,edema  change in exercise tolerance. GI /GU: No adominal pain, vomiting, change in bowel habits. No blood in the stool. No significant GU symptoms. SKIN/HEME: ,no acute skin rashes suspicious lesions or bleeding. No lymphadenopathy, nodules, masses.  NEURO/ PSYCH:  No neurologic signs such as weakness numbness. No depression anxiety. IMM/ Allergy: No unusual infections.  Allergy .   REST of 12 system review negative except as per HPI   Past Medical History:  Diagnosis Date  . Chicken pox    as a child  . Hypertension     Past Surgical History:  Procedure Laterality Date  . bx right breast benign fibroadenoma    . miscarriages     1998-1999 tubal pg  . right tube and ovary     remove tubal pregnancy 1999    Family History    Problem Relation Age of Onset  . Cancer Mother   . Hypertension Mother   . Diabetes Mother   . Diabetes Father   . Gout Sister   . Healthy Brother     Social History   Socioeconomic History  . Marital status: Married    Spouse name: Not on file  . Number of children: 1  . Years of education: 37  . Highest education level: Not on file  Occupational History  . Occupation: Copywriter, advertising: [employer]  Social Needs  . Financial resource strain: Not on file  . Food insecurity:    Worry: Not on file    Inability: Not on file  . Transportation needs:    Medical: Not on file    Non-medical: Not on file  Tobacco Use  . Smoking status: Former Smoker    Years: 9.00    Types: Cigarettes  Substance and Sexual Activity  . Alcohol use: No  . Drug use: No  . Sexual activity: Not on file  Lifestyle  . Physical activity:    Days per week: Not on file    Minutes per session: Not  on file  . Stress: Not on file  Relationships  . Social connections:    Talks on phone: Not on file    Gets together: Not on file    Attends religious service: Not on file    Active member of club or organization: Not on file    Attends meetings of clubs or organizations: Not on file    Relationship status: Not on file  Other Topics Concern  . Not on file  Social History Narrative   Married self employed real estate   About  50 minutes.   College degree   hh of 2   No pets              Outpatient Medications Prior to Visit  Medication Sig Dispense Refill  . benzonatate (TESSALON) 100 MG capsule Take 1-2 capsules (100-200 mg total) by mouth 3 (three) times daily as needed for cough. 40 capsule 0  . chlorthalidone (HYGROTON) 25 MG tablet Take 1 tablet (25 mg total) by mouth daily. 30 tablet 3  . ipratropium (ATROVENT) 0.03 % nasal spray Place 2 sprays into the nose 2 (two) times daily. 30 mL 0   No facility-administered medications prior to visit.      EXAM:  BP (!) 152/82 (BP Location:  Right Arm, Patient Position: Sitting, Cuff Size: Large)   Pulse 83   Temp 98.5 F (36.9 C) (Oral)   Ht 5' 3.5" (1.613 m)   Wt 199 lb 14.4 oz (90.7 kg)   BMI 34.86 kg/m   Body mass index is 34.86 kg/m. Wt Readings from Last 3 Encounters:  01/08/18 199 lb 14.4 oz (90.7 kg)  05/11/12 186 lb (84.4 kg)  10/18/11 186 lb (84.4 kg)    Physical Exam: Vital signs reviewed ULA:GTXM is a well-developed well-nourished alert cooperative    who appearsr stated age in no acute distress.  HEENT: normocephalic atraumatic , Eyes: PERRL EOM's full, conjunctiva clear, Nares: paten,t no deformity discharge or tenderness., Ears: no deformity EAC's clear TMs with normal landmarks. Mouth: clear OP, no lesions, edema.  Moist mucous membranes. Dentition in adequate repair. NECK: supple without masses, thyromegaly or bruits. CHEST/PULM:  Clear to auscultation and percussion breath sounds equal no wheeze , rales or rhonchi. arge . CV: PMI is nondisplaced, S1 S2 no gallops, murmurs, rubs. Peripheral pulses are full without delay.No JVD .  ABDOMEN: Bowel sounds normal nontender  No guard or rebound, no hepato splenomegal no CVA tenderness.   Extremtities:  No clubbing cyanosis or edema, no acute joint swelling or redness no focal atrophy NEURO:  Oriented x3, cranial nerves 3-12 appear to be intact, no obvious focal weakness,gait within normal limits no abnormal reflexes or asymmetrical SKIN: No acute rashes normal turgor, color, no bruising or petechiae. PSYCH: Oriented, good eye contact, no obvious depression anxiety, cognition and judgment appear normal. LN: no cervical adenopathy  Lab Results  Component Value Date   WBC 6.1 01/08/2018   HGB 13.0 01/08/2018   HCT 40.1 01/08/2018   PLT 266.0 01/08/2018   GLUCOSE 96 01/08/2018   CHOL 216 (H) 01/08/2018   TRIG 140.0 01/08/2018   HDL 42.10 01/08/2018   LDLDIRECT 122.7 06/14/2011   LDLCALC 146 (H) 01/08/2018   ALT 20 01/08/2018   AST 19 01/08/2018   NA  139 01/08/2018   K 4.2 01/08/2018   CL 103 01/08/2018   CREATININE 0.94 01/08/2018   BUN 12 01/08/2018   CO2 29 01/08/2018   TSH 2.96 01/08/2018  BP Readings from Last 3 Encounters:  01/08/18 (!) 152/82  05/11/12 140/80  10/18/11 128/74   Wt Readings from Last 3 Encounters:  01/08/18 199 lb 14.4 oz (90.7 kg)  05/11/12 186 lb (84.4 kg)  10/18/11 186 lb (84.4 kg)       ASSESSMENT AND PLAN:  Discussed the following assessment and plan:  Essential hypertension - Plan: Basic metabolic panel, CBC with Differential/Platelet, Hepatic function panel, Lipid panel, TSH  Medication management - Plan: Basic metabolic panel, CBC with Differential/Platelet, Hepatic function panel, Lipid panel, TSH  Need for influenza vaccination - Plan: Flu Vaccine QUAD 6+ mos PF IM (Fluarix Quad PF)  Encounter to establish care  BMI 34.0-34.9,adult reestablishing and in past had ht on med .   Disc plan and goals  Lab today  And begin bp med  Check readings record and rov with monitor readings and plan at that time  Counseled. About   healthy eating would help weight control.( sugars sweets and processed carbs)  Counseled. See  Instructions  Patient Care Team: Jaeley Wiker, [name], MD as PCP - General Maxie Better, MD as Attending Physician (Obstetrics and Gynecology) Charna Elizabeth, MD as Attending Physician (Gastroenterology) Patient Instructions  Your bp is too high and is risk for future  Risk  Usually treated byu life style and medications together .  Take blood pressure readings twice a day for 7- 10 days and then periodically .  And record .   Begin  Medication diuretic good for BP control .   Best is 120/80    Goal but can accept  135/85 and below .    lifestyle intervention healthy eating and exercise .  Cut out caloroies from beverages sugars ( mild ok)   dont eat late after 7 or so  Stop limimt sweets processed carbs  As discussed    DASH Eating Plan DASH stands for "Dietary  Approaches to Stop Hypertension." The DASH eating plan is a healthy eating plan that has been shown to reduce high blood pressure (hypertension). It may also reduce your risk for type 2 diabetes, heart disease, and stroke. The DASH eating plan may also help with weight loss. What are tips for following this plan?  General guidelines  Avoid eating more than 2,300 mg (milligrams) of salt (sodium) a day. If you have hypertension, you may need to reduce your sodium intake to 1,500 mg a day.  Limit alcohol intake to no more than 1 drink a day for nonpregnant women and 2 drinks a day for men. One drink equals 12 oz of beer, 5 oz of wine, or 1 oz of hard liquor.  Work with your health care provider to maintain a healthy body weight or to lose weight. Ask what an ideal weight is for you.  Get at least 30 minutes of exercise that causes your heart to beat faster (aerobic exercise) most days of the week. Activities may include walking, swimming, or biking.  Work with your health care provider or diet and nutrition specialist (dietitian) to adjust your eating plan to your individual calorie needs. Reading food labels   Check food labels for the amount of sodium per serving. Choose foods with less than 5 percent of the Daily Value of sodium. Generally, foods with less than 300 mg of sodium per serving fit into this eating plan.  To find whole grains, look for the word "whole" as the first word in the ingredient list. Shopping  Buy products labeled as "low-sodium" or "  no salt added."  Buy fresh foods. Avoid canned foods and premade or frozen meals. Cooking  Avoid adding salt when cooking. Use salt-free seasonings or herbs instead of table salt or sea salt. Check with your health care provider or pharmacist before using salt substitutes.  Do not fry foods. Cook foods using healthy methods such as baking, boiling, grilling, and broiling instead.  Cook with heart-healthy oils, such as olive, canola,  soybean, or sunflower oil. Meal planning  Eat a balanced diet that includes: ? 5 or more servings of fruits and vegetables each day. At each meal, try to fill half of your plate with fruits and vegetables. ? Up to 6-8 servings of whole grains each day. ? Less than 6 oz of lean meat, poultry, or fish each day. A 3-oz serving of meat is about the same size as a deck of cards. One egg equals 1 oz. ? 2 servings of low-fat dairy each day. ? A serving of nuts, seeds, or beans 5 times each week. ? Heart-healthy fats. Healthy fats called Omega-3 fatty acids are found in foods such as flaxseeds and coldwater fish, like sardines, salmon, and mackerel.  Limit how much you eat of the following: ? Canned or prepackaged foods. ? Food that is high in trans fat, such as fried foods. ? Food that is high in saturated fat, such as fatty meat. ? Sweets, desserts, sugary drinks, and other foods with added sugar. ? Full-fat dairy products.  Do not salt foods before eating.  Try to eat at least 2 vegetarian meals each week.  Eat more home-cooked food and less restaurant, buffet, and fast food.  When eating at a restaurant, ask that your food be prepared with less salt or no salt, if possible. What foods are recommended? The items listed may not be a complete list. Talk with your dietitian about what dietary choices are best for you. Grains Whole-grain or whole-wheat bread. Whole-grain or whole-wheat pasta. [place] rice. Orpah Cobbatmeal. Quinoa. Bulgur. Whole-grain and low-sodium cereals. Pita bread. Low-fat, low-sodium crackers. Whole-wheat flour tortillas. Vegetables Fresh or frozen vegetables (raw, steamed, roasted, or grilled). Low-sodium or reduced-sodium tomato and vegetable juice. Low-sodium or reduced-sodium tomato sauce and tomato paste. Low-sodium or reduced-sodium canned vegetables. Fruits All fresh, dried, or frozen fruit. Canned fruit in natural juice (without added sugar). Meat and other protein  foods Skinless chicken or [place]urkey. Ground chicken or [place]key. Pork with fat trimmed off. Fish and seafood. Egg whites. Dried beans, peas, or lentils. Unsalted nuts, nut butters, and seeds. Unsalted canned beans. Lean cuts of beef with fat trimmed off. Low-sodium, lean deli meat. Dairy Low-fat (1%) or fat-free (skim) milk. Fat-free, low-fat, or reduced-fat cheeses. Nonfat, low-sodium ricotta or cottage cheese. Low-fat or nonfat yogurt. Low-fat, low-sodium cheese. Fats and oils Soft margarine without trans fats. Vegetable oil. Low-fat, reduced-fat, or light mayonnaise and salad dressings (reduced-sodium). Canola, safflower, olive, soybean, and sunflower oils. Avocado. Seasoning and other foods Herbs. Spices. Seasoning mixes without salt. Unsalted popcorn and pretzels. Fat-free sweets. What foods are not recommended? The items listed may not be a complete list. Talk with your dietitian about what dietary choices are best for you. Grains Baked goods made with fat, such as croissants, muffins, or some breads. Dry pasta or rice meal packs. Vegetables Creamed or fried vegetables. Vegetables in a cheese sauce. Regular canned vegetables (not low-sodium or reduced-sodium). Regular canned tomato sauce and paste (not low-sodium or reduced-sodium). Regular tomato and vegetable juice (not low-sodium or reduced-sodium). Rosita FirePickles. Olives. Fruits  Canned fruit in a light or heavy syrup. Fried fruit. Fruit in cream or butter sauce. Meat and other protein foods Fatty cuts of meat. Ribs. Fried meat. Tomasa BlaseBacon. Sausage. Bologna and other processed lunch meats. Salami. Fatback. Hotdogs. Bratwurst. Salted nuts and seeds. Canned beans with added salt. Canned or smoked fish. Whole eggs or egg yolks. Chicken or [place]key with skin. Dairy Whole or 2% milk, cream, and half-and-half. Whole or full-fat cream cheese. Whole-fat or sweetened yogurt. Full-fat cheese. Nondairy creamers. Whipped toppings. Processed cheese and cheese  spreads. Fats and oils Butter. Stick margarine. Lard. Shortening. Ghee. Bacon fat. Tropical oils, such as coconut, palm kernel, or palm oil. Seasoning and other foods Salted popcorn and pretzels. Onion salt, garlic salt, seasoned salt, table salt, and sea salt. Worcestershire sauce. Tartar sauce. Barbecue sauce. Teriyaki sauce. Soy sauce, including reduced-sodium. Steak sauce. Canned and packaged gravies. Fish sauce. Oyster sauce. Cocktail sauce. Horseradish that you find on the shelf. Ketchup. Mustard. Meat flavorings and tenderizers. Bouillon cubes. Hot sauce and Tabasco sauce. Premade or packaged marinades. Premade or packaged taco seasonings. Relishes. Regular salad dressings. Where to find more information:  National Heart, Lung, and Blood Institute: PopSteam.iswww.nhlbi.nih.gov  American Heart Association: www.heart.org Summary  The DASH eating plan is a healthy eating plan that has been shown to reduce high blood pressure (hypertension). It may also reduce your risk for type 2 diabetes, heart disease, and stroke.  With the DASH eating plan, you should limit salt (sodium) intake to 2,300 mg a day. If you have hypertension, you may need to reduce your sodium intake to 1,500 mg a day.  When on the DASH eating plan, aim to eat more fresh fruits and vegetables, whole grains, lean proteins, low-fat dairy, and heart-healthy fats.  Work with your health care provider or diet and nutrition specialist (dietitian) to adjust your eating plan to your individual calorie needs. This information is not intended to replace advice given to you by your health care provider. Make sure you discuss any questions you have with your health care provider. Document Released: 12/13/2010 Document Revised: 12/18/2015 Document Reviewed: 12/18/2015 Elsevier Interactive Patient Education  2019 [employer]        McFallWanda K. Aislinn Feliz M.D.

## 2018-01-08 NOTE — Patient Instructions (Addendum)
Your bp is too high and is risk for future  Risk  Usually treated byu life style and medications together .  Take blood pressure readings twice a day for 7- 10 days and then periodically .  And record .   Begin  Medication diuretic good for BP control .   Best is 120/80    Goal but can accept  135/85 and below .    lifestyle intervention healthy eating and exercise .  Cut out caloroies from beverages sugars ( mild ok)   dont eat late after 7 or so  Stop limimt sweets processed carbs  As discussed    DASH Eating Plan DASH stands for "Dietary Approaches to Stop Hypertension." The DASH eating plan is a healthy eating plan that has been shown to reduce high blood pressure (hypertension). It may also reduce your risk for type 2 diabetes, heart disease, and stroke. The DASH eating plan may also help with weight loss. What are tips for following this plan?  General guidelines  Avoid eating more than 2,300 mg (milligrams) of salt (sodium) a day. If you have hypertension, you may need to reduce your sodium intake to 1,500 mg a day.  Limit alcohol intake to no more than 1 drink a day for nonpregnant women and 2 drinks a day for men. One drink equals 12 oz of beer, 5 oz of wine, or 1 oz of hard liquor.  Work with your health care provider to maintain a healthy body weight or to lose weight. Ask what an ideal weight is for you.  Get at least 30 minutes of exercise that causes your heart to beat faster (aerobic exercise) most days of the week. Activities may include walking, swimming, or biking.  Work with your health care provider or diet and nutrition specialist (dietitian) to adjust your eating plan to your individual calorie needs. Reading food labels   Check food labels for the amount of sodium per serving. Choose foods with less than 5 percent of the Daily Value of sodium. Generally, foods with less than 300 mg of sodium per serving fit into this eating plan.  To find whole grains, look  for the word "whole" as the first word in the ingredient list. Shopping  Buy products labeled as "low-sodium" or "no salt added."  Buy fresh foods. Avoid canned foods and premade or frozen meals. Cooking  Avoid adding salt when cooking. Use salt-free seasonings or herbs instead of table salt or sea salt. Check with your health care provider or pharmacist before using salt substitutes.  Do not fry foods. Cook foods using healthy methods such as baking, boiling, grilling, and broiling instead.  Cook with heart-healthy oils, such as olive, canola, soybean, or sunflower oil. Meal planning  Eat a balanced diet that includes: ? 5 or more servings of fruits and vegetables each day. At each meal, try to fill half of your plate with fruits and vegetables. ? Up to 6-8 servings of whole grains each day. ? Less than 6 oz of lean meat, poultry, or fish each day. A 3-oz serving of meat is about the same size as a deck of cards. One egg equals 1 oz. ? 2 servings of low-fat dairy each day. ? A serving of nuts, seeds, or beans 5 times each week. ? Heart-healthy fats. Healthy fats called Omega-3 fatty acids are found in foods such as flaxseeds and coldwater fish, like sardines, salmon, and mackerel.  Limit how much you eat of the following: ?  Canned or prepackaged foods. ? Food that is high in trans fat, such as fried foods. ? Food that is high in saturated fat, such as fatty meat. ? Sweets, desserts, sugary drinks, and other foods with added sugar. ? Full-fat dairy products.  Do not salt foods before eating.  Try to eat at least 2 vegetarian meals each week.  Eat more home-cooked food and less restaurant, buffet, and fast food.  When eating at a restaurant, ask that your food be prepared with less salt or no salt, if possible. What foods are recommended? The items listed may not be a complete list. Talk with your dietitian about what dietary choices are best for you. Grains Whole-grain or  whole-wheat bread. Whole-grain or whole-wheat pasta. Hokenson rice. Orpah Cobb. Bulgur. Whole-grain and low-sodium cereals. Pita bread. Low-fat, low-sodium crackers. Whole-wheat flour tortillas. Vegetables Fresh or frozen vegetables (raw, steamed, roasted, or grilled). Low-sodium or reduced-sodium tomato and vegetable juice. Low-sodium or reduced-sodium tomato sauce and tomato paste. Low-sodium or reduced-sodium canned vegetables. Fruits All fresh, dried, or frozen fruit. Canned fruit in natural juice (without added sugar). Meat and other protein foods Skinless chicken or [place]. Ground chicken or [place]. Pork with fat trimmed off. Fish and seafood. Egg whites. Dried beans, peas, or lentils. Unsalted nuts, nut butters, and seeds. Unsalted canned beans. Lean cuts of beef with fat trimmed off. Low-sodium, lean deli meat. Dairy Low-fat (1%) or fat-free (skim) milk. Fat-free, low-fat, or reduced-fat cheeses. Nonfat, low-sodium ricotta or cottage cheese. Low-fat or nonfat yogurt. Low-fat, low-sodium cheese. Fats and oils Soft margarine without trans fats. Vegetable oil. Low-fat, reduced-fat, or light mayonnaise and salad dressings (reduced-sodium). Canola, safflower, olive, soybean, and sunflower oils. Avocado. Seasoning and other foods Herbs. Spices. Seasoning mixes without salt. Unsalted popcorn and pretzels. Fat-free sweets. What foods are not recommended? The items listed may not be a complete list. Talk with your dietitian about what dietary choices are best for you. Grains Baked goods made with fat, such as croissants, muffins, or some breads. Dry pasta or rice meal packs. Vegetables Creamed or fried vegetables. Vegetables in a cheese sauce. Regular canned vegetables (not low-sodium or reduced-sodium). Regular canned tomato sauce and paste (not low-sodium or reduced-sodium). Regular tomato and vegetable juice (not low-sodium or reduced-sodium). Rosita Fire. Olives. Fruits Canned fruit in a light  or heavy syrup. Fried fruit. Fruit in cream or butter sauce. Meat and other protein foods Fatty cuts of meat. Ribs. Fried meat. Tomasa Blase. Sausage. Bologna and other processed lunch meats. Salami. Fatback. Hotdogs. Bratwurst. Salted nuts and seeds. Canned beans with added salt. Canned or smoked fish. Whole eggs or egg yolks. Chicken or [place] with skin. Dairy Whole or 2% milk, cream, and half-and-half. Whole or full-fat cream cheese. Whole-fat or sweetened yogurt. Full-fat cheese. Nondairy creamers. Whipped toppings. Processed cheese and cheese spreads. Fats and oils Butter. Stick margarine. Lard. Shortening. Ghee. Bacon fat. Tropical oils, such as coconut, palm kernel, or palm oil. Seasoning and other foods Salted popcorn and pretzels. Onion salt, garlic salt, seasoned salt, table salt, and sea salt. Worcestershire sauce. Tartar sauce. Barbecue sauce. Teriyaki sauce. Soy sauce, including reduced-sodium. Steak sauce. Canned and packaged gravies. Fish sauce. Oyster sauce. Cocktail sauce. Horseradish that you find on the shelf. Ketchup. Mustard. Meat flavorings and tenderizers. Bouillon cubes. Hot sauce and Tabasco sauce. Premade or packaged marinades. Premade or packaged taco seasonings. Relishes. Regular salad dressings. Where to find more information:  National Heart, Lung, and Blood Institute: PopSteam.is  American Heart Association: www.heart.org Summary  The DASH eating plan is a healthy eating plan that has been shown to reduce high blood pressure (hypertension). It may also reduce your risk for type 2 diabetes, heart disease, and stroke.  With the DASH eating plan, you should limit salt (sodium) intake to 2,300 mg a day. If you have hypertension, you may need to reduce your sodium intake to 1,500 mg a day.  When on the DASH eating plan, aim to eat more fresh fruits and vegetables, whole grains, lean proteins, low-fat dairy, and heart-healthy fats.  Work with your health care provider or  diet and nutrition specialist (dietitian) to adjust your eating plan to your individual calorie needs. This information is not intended to replace advice given to you by your health care provider. Make sure you discuss any questions you have with your health care provider. Document Released: 12/13/2010 Document Revised: 12/18/2015 Document Reviewed: 12/18/2015 Elsevier Interactive Patient Education  2019 [employer]

## 2018-01-11 ENCOUNTER — Encounter: Payer: Self-pay | Admitting: Internal Medicine

## 2018-03-11 ENCOUNTER — Ambulatory Visit: Payer: 59 | Admitting: Internal Medicine

## 2018-04-01 ENCOUNTER — Telehealth: Payer: Self-pay

## 2018-04-01 NOTE — Telephone Encounter (Signed)
Left voicemail offering pt to do webex visit. Okay for nurse triage to disclose. Crm created

## 2018-04-02 ENCOUNTER — Telehealth: Payer: Self-pay | Admitting: *Deleted

## 2018-04-02 NOTE — Telephone Encounter (Signed)
RN spoke with  Patient. Patient rescheduled appointment for Tuesday via Web Ex at 11AM. Patient email verified to send invitation. Please send meeting invite to patient.

## 2018-04-03 NOTE — Telephone Encounter (Signed)
email has been sent with webex appt.

## 2018-04-06 NOTE — Progress Notes (Signed)
Virtual Visit via Video Note  I connected with@ on 04/07/18 at 11:00 AM EDT by a video enabled telemedicine application and verified that I am speaking with the correct person using two identifiers. Had to use face time cause of technical difficulty   Location patient: home Location provider:work office Persons participating in the virtual visit: patient, provider  [place] national recommendations  regarding COVID 19 pandemic   video visit is advised over in office visit for this patient.  Discussed the limitations of evaluation and management by telemedicine and  availability of in person appointments. The patient expressed understanding and agreed to proceed.   HPI: Morgan Maldonado  Doing 3 mos  Video visits.    FU bp readings   Seen January   Began diuretic rx   bp readings   Mostly down to 133/79 and last 139/80  Taking  maxide for 2-3 months  Feeling fine .  Was exerciseing  And eating healthier before covid19 isolation . 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   Past Medical History:  Diagnosis Date  . Chicken pox    as a child  . Hypertension     Past Surgical History:  Procedure Laterality Date  . bx right breast benign fibroadenoma    . miscarriages     1998-1999 tubal pg  . right tube and ovary     remove tubal pregnancy 1999    Family History  Problem Relation Age of Onset  . Cancer Mother   . Hypertension Mother   . Diabetes Mother   . Diabetes Father   . Gout Sister   . Healthy Brother     SOCIAL HX:    Current Outpatient Medications:  .  triamterene-hydrochlorothiazide (MAXZIDE-25) 37.5-25 MG tablet, Take 1 tablet by mouth daily., Disp: 90 tablet, Rfl: 1  EXAM:  VITALS per patient if applicable:  Reported bp as above  Looks quite well.   GENERAL: alert, oriented, appears well and in no acute distress  HEENT: atraumatic, conjunttiva clear, no obvious  abnormalities on inspection of external nose and ears  NECK: normal movements of the head and neck  LUNGS: on inspection no signs of respiratory distress, breathing rate appears normal, no obvious gross SOB, gasping or wheezing  CV: no obvious cyanosis  MS: moves all visible extremities without noticeable abnormality  PSYCH/NEURO: pleasant and cooperative, no obvious depression or anxiety, speech and thought processing grossly intact Lab Results  Component Value Date   WBC 6.1 01/08/2018   HGB 13.0 01/08/2018   HCT 40.1 01/08/2018   PLT 266.0 01/08/2018   GLUCOSE 96 01/08/2018   CHOL 216 (H) 01/08/2018   TRIG 140.0 01/08/2018   HDL 42.10 01/08/2018   LDLDIRECT 122.7 06/14/2011   LDLCALC 146 (H) 01/08/2018   ALT 20 01/08/2018   AST 19 01/08/2018   NA 139 01/08/2018   K 4.2 01/08/2018   CL 103 01/08/2018   CREATININE 0.94 01/08/2018   BUN 12 01/08/2018   CO2 29 01/08/2018   TSH 2.96 01/08/2018   Wt Readings from Last 3 Encounters:  01/08/18 199 lb 14.4 oz (90.7 kg)  05/11/12 186 lb (84.4 kg)  10/18/11 186 lb (84.4 kg)    ASSESSMENT AND PLAN:  Discussed the following assessment and plan:  Essential hypertension  Medication management  BMI 34.0-34.9,adult  bp readings  Are better but  not best        Discussion  And plan   continue med and readings as possible   Lose weight get exercise    Walking alone and isolated should be ok and avoid touching  and optimize hand  Cleansing.       Expectant management and discussion of plan and treatment with patient with opportunity to ask questions and all were answered. The patient agreed with the plan and demonstrated an understanding of the instructions.   The patient was advised to call back or seek an in-person evaluation if  concerns    or if the condition fails to improve as anticipated.   Plan rov  In the summer  June or other  Or  Contact us if  Needed in interim  May do fu labs at that time    Berniece Andreas, MD

## 2018-04-07 ENCOUNTER — Encounter: Payer: Self-pay | Admitting: Internal Medicine

## 2018-04-07 ENCOUNTER — Ambulatory Visit (INDEPENDENT_AMBULATORY_CARE_PROVIDER_SITE_OTHER): Payer: 59 | Admitting: Internal Medicine

## 2018-04-07 ENCOUNTER — Other Ambulatory Visit: Payer: Self-pay

## 2018-04-07 ENCOUNTER — Ambulatory Visit: Payer: 59 | Admitting: Internal Medicine

## 2018-04-07 DIAGNOSIS — Z79899 Other long term (current) drug therapy: Secondary | ICD-10-CM | POA: Diagnosis not present

## 2018-04-07 DIAGNOSIS — Z6834 Body mass index (BMI) 34.0-34.9, adult: Secondary | ICD-10-CM

## 2018-04-07 DIAGNOSIS — I10 Essential (primary) hypertension: Secondary | ICD-10-CM

## 2018-07-18 ENCOUNTER — Other Ambulatory Visit: Payer: Self-pay | Admitting: Internal Medicine

## 2019-04-17 ENCOUNTER — Ambulatory Visit: Payer: Self-pay | Attending: Internal Medicine

## 2019-04-17 DIAGNOSIS — Z23 Encounter for immunization: Secondary | ICD-10-CM

## 2019-04-17 NOTE — Progress Notes (Signed)
   Covid-19 Vaccination Clinic  Name:  [name]    MRN: [id number] DOB: Jan 08, 1955  04/17/2019  [name] was observed post Covid-19 immunization for 15 minutes without incident. She was provided with Vaccine Information Sheet and instruction to access the V-Safe system.   [name] was instructed to call 911 with any severe reactions post vaccine: Marland Kitchen Difficulty breathing  . Swelling of face and throat  . A fast heartbeat  . A bad rash all over body  . Dizziness and weakness   Immunizations Administered    Name Date Dose VIS Date Route   Moderna COVID-19 Vaccine 04/17/2019 10:50 AM 0.5 mL 12/08/2018 Intramuscular   Manufacturer: Moderna   Lot: [id number]   NDC: 36681-594-70

## 2019-05-15 ENCOUNTER — Ambulatory Visit: Payer: Self-pay | Attending: Internal Medicine

## 2019-05-15 DIAGNOSIS — Z23 Encounter for immunization: Secondary | ICD-10-CM

## 2019-05-15 NOTE — Progress Notes (Signed)
   Covid-19 Vaccination Clinic  Name:  Morgan Maldonado    MRN: [id number] DOB: 1955/02/18  05/15/2019  Ms. [name] was observed post Covid-19 immunization for 15 minutes without incident. She was provided with Vaccine Information Sheet and instruction to access the V-Safe system.   Ms. [name] was instructed to call 911 with any severe reactions post vaccine: Marland Kitchen Difficulty breathing  . Swelling of face and throat  . A fast heartbeat  . A bad rash all over body  . Dizziness and weakness   Immunizations Administered    Name Date Dose VIS Date Route   Moderna COVID-19 Vaccine 05/15/2019 10:57 AM 0.5 mL 12/2018 Intramuscular   Manufacturer: Moderna   Lot: [id number]   NDC: 44695-072-25

## 2020-04-15 ENCOUNTER — Encounter (HOSPITAL_COMMUNITY): Payer: Self-pay

## 2020-04-15 ENCOUNTER — Other Ambulatory Visit: Payer: Self-pay

## 2020-04-15 ENCOUNTER — Observation Stay (HOSPITAL_COMMUNITY)
Admission: EM | Admit: 2020-04-15 | Discharge: 2020-04-16 | Disposition: A | Discharge status: Home / Self Care | Payer: 59 | Attending: Internal Medicine | Admitting: Internal Medicine

## 2020-04-15 DIAGNOSIS — R001 Bradycardia, unspecified: Secondary | ICD-10-CM

## 2020-04-15 DIAGNOSIS — G459 Transient cerebral ischemic attack, unspecified: Secondary | ICD-10-CM | POA: Diagnosis present

## 2020-04-15 DIAGNOSIS — Z6832 Body mass index (BMI) 32.0-32.9, adult: Secondary | ICD-10-CM | POA: Diagnosis not present

## 2020-04-15 DIAGNOSIS — E669 Obesity, unspecified: Secondary | ICD-10-CM | POA: Diagnosis present

## 2020-04-15 DIAGNOSIS — Z79899 Other long term (current) drug therapy: Secondary | ICD-10-CM | POA: Insufficient documentation

## 2020-04-15 DIAGNOSIS — Z87891 Personal history of nicotine dependence: Secondary | ICD-10-CM | POA: Diagnosis not present

## 2020-04-15 DIAGNOSIS — I160 Hypertensive urgency: Secondary | ICD-10-CM | POA: Diagnosis not present

## 2020-04-15 DIAGNOSIS — I10 Essential (primary) hypertension: Secondary | ICD-10-CM | POA: Insufficient documentation

## 2020-04-15 DIAGNOSIS — Z20822 Contact with and (suspected) exposure to covid-19: Secondary | ICD-10-CM | POA: Insufficient documentation

## 2020-04-15 LAB — BASIC METABOLIC PANEL
Anion gap: 7 (ref 5–15)
BUN: 14 mg/dL (ref 8–23)
CO2: 27 mmol/L (ref 22–32)
Calcium: 8.9 mg/dL (ref 8.9–10.3)
Chloride: 102 mmol/L (ref 98–111)
Creatinine, Ser: 0.8 mg/dL (ref 0.44–1.00)
GFR, Estimated: >60 mL/min (ref >60)
Glucose, Bld: 91 mg/dL (ref 70–99)
Potassium: 4 mmol/L (ref 3.5–5.1)
Sodium: 136 mmol/L (ref 135–145)

## 2020-04-15 LAB — URINALYSIS, ROUTINE W REFLEX MICROSCOPIC
Bilirubin Urine: NEGATIVE
Glucose, UA: NEGATIVE mg/dL
Hgb urine dipstick: NEGATIVE
Ketones, ur: NEGATIVE mg/dL
Nitrite: NEGATIVE
Protein, ur: NEGATIVE mg/dL
Specific Gravity, Urine: 1.008 (ref 1.005–1.030)
pH: 7 (ref 5.0–8.0)

## 2020-04-15 LAB — CBC
HCT: 40 % (ref 36.0–46.0)
Hemoglobin: 12.8 g/dL (ref 12.0–15.0)
MCH: 26.4 pg (ref 26.0–34.0)
MCHC: 32 g/dL (ref 30.0–36.0)
MCV: 82.6 fL (ref 80.0–100.0)
Platelets: 288 10*3/uL (ref 150–400)
RBC: 4.84 MIL/uL (ref 3.87–5.11)
RDW: 13.2 % (ref 11.5–15.5)
WBC: 7.2 10*3/uL (ref 4.0–10.5)
nRBC: 0 % (ref 0.0–0.2)

## 2020-04-15 LAB — CBG MONITORING, ED: Glucose-Capillary: 93 mg/dL (ref 70–99)

## 2020-04-15 NOTE — ED Triage Notes (Signed)
Patient state she had some numbness on her L sided, states she had some tingling in her fingers and L face, reports symptoms have resolved

## 2020-04-16 ENCOUNTER — Observation Stay (HOSPITAL_COMMUNITY): Payer: 59

## 2020-04-16 ENCOUNTER — Observation Stay (HOSPITAL_BASED_OUTPATIENT_CLINIC_OR_DEPARTMENT_OTHER): Payer: 59

## 2020-04-16 ENCOUNTER — Emergency Department (HOSPITAL_COMMUNITY): Payer: 59

## 2020-04-16 DIAGNOSIS — R001 Bradycardia, unspecified: Secondary | ICD-10-CM

## 2020-04-16 DIAGNOSIS — G459 Transient cerebral ischemic attack, unspecified: Secondary | ICD-10-CM

## 2020-04-16 DIAGNOSIS — I160 Hypertensive urgency: Secondary | ICD-10-CM

## 2020-04-16 LAB — ECHOCARDIOGRAM COMPLETE
Area-P 1/2: 2.95 cm2
Height: 64 in
S' Lateral: 2.1 cm
Weight: 3040 oz

## 2020-04-16 LAB — HIV ANTIBODY (ROUTINE TESTING W REFLEX): HIV Screen 4th Generation wRfx: NONREACTIVE

## 2020-04-16 LAB — LIPID PANEL
Cholesterol: 256 mg/dL — ABNORMAL HIGH (ref 0–200)
HDL: 51 mg/dL (ref >40)
LDL Cholesterol: 172 mg/dL — ABNORMAL HIGH (ref 0–99)
Total CHOL/HDL Ratio: 5 RATIO
Triglycerides: 166 mg/dL — ABNORMAL HIGH (ref <150)
VLDL: 33 mg/dL (ref 0–40)

## 2020-04-16 LAB — HEMOGLOBIN A1C
Hgb A1c MFr Bld: 6.3 % — ABNORMAL HIGH (ref 4.8–5.6)
Mean Plasma Glucose: 134.11 mg/dL

## 2020-04-16 LAB — SARS CORONAVIRUS 2 (TAT 6-24 HRS): SARS Coronavirus 2: NEGATIVE

## 2020-04-16 LAB — TSH: TSH: 3.389 u[IU]/mL (ref 0.350–4.500)

## 2020-04-16 MED ORDER — ASPIRIN 81 MG PO TBEC: 81.0000 mg | DELAYED_RELEASE_TABLET | Freq: Every day | ORAL | 11 refills | Status: AC

## 2020-04-16 MED ORDER — ACETAMINOPHEN 325 MG PO TABS: 650.0000 mg | ORAL_TABLET | ORAL | Status: DC | PRN

## 2020-04-16 MED ORDER — ASPIRIN 325 MG PO TABS
325.0000 mg | ORAL_TABLET | Freq: Every day | ORAL | Status: DC
  Administered 2020-04-16: 325 mg via ORAL
  Filled 2020-04-16: qty 1

## 2020-04-16 MED ORDER — IOHEXOL 350 MG/ML SOLN
75.0000 mL | Freq: Once | INTRAVENOUS | Status: AC | PRN
  Administered 2020-04-16: 75 mL via INTRAVENOUS

## 2020-04-16 MED ORDER — ACETAMINOPHEN 160 MG/5ML PO SOLN: 650.0000 mg | ORAL | Status: DC | PRN

## 2020-04-16 MED ORDER — ASPIRIN EC 81 MG PO TBEC
81.0000 mg | DELAYED_RELEASE_TABLET | Freq: Every day | ORAL | Status: DC
  Administered 2020-04-16: 81 mg via ORAL
  Filled 2020-04-16: qty 1

## 2020-04-16 MED ORDER — ACETAMINOPHEN 650 MG RE SUPP: 650.0000 mg | RECTAL | Status: DC | PRN

## 2020-04-16 MED ORDER — TRIAMTERENE-HCTZ 37.5-25 MG PO TABS
1.0000 | ORAL_TABLET | Freq: Every day | ORAL | Status: DC
  Administered 2020-04-16: 1 via ORAL
  Filled 2020-04-16: qty 1

## 2020-04-16 MED ORDER — ROSUVASTATIN CALCIUM 5 MG PO TABS
10.0000 mg | ORAL_TABLET | Freq: Every day | ORAL | Status: DC
  Administered 2020-04-16: 10 mg via ORAL
  Filled 2020-04-16: qty 2

## 2020-04-16 MED ORDER — CLOPIDOGREL BISULFATE 75 MG PO TABS
75.0000 mg | ORAL_TABLET | Freq: Every day | ORAL | Status: DC
  Administered 2020-04-16: 75 mg via ORAL
  Filled 2020-04-16: qty 1

## 2020-04-16 MED ORDER — ROSUVASTATIN CALCIUM 10 MG PO TABS: 10.0000 mg | ORAL_TABLET | Freq: Every day | ORAL | 1 refills | Status: DC

## 2020-04-16 MED ORDER — ENOXAPARIN SODIUM 40 MG/0.4ML ~~LOC~~ SOLN
40.0000 mg | Freq: Every day | SUBCUTANEOUS | Status: DC
  Administered 2020-04-16: 40 mg via SUBCUTANEOUS
  Filled 2020-04-16: qty 0.4

## 2020-04-16 MED ORDER — STROKE: EARLY STAGES OF RECOVERY BOOK
Freq: Once | Status: AC
  Filled 2020-04-16: qty 1

## 2020-04-16 NOTE — Evaluation (Signed)
Physical Therapy Evaluation and Discharge Patient Details Name: Yuliya Nova MRN: [id number] DOB: [birth year] Today's Date: 04/16/2020   History of Present Illness  65yo female admitted with L side numbness htn emergency and bradycardic that she reports has resolved. PMH chicken pox HTn obesity  Clinical Impression  Patient evaluated by Physical Therapy with no further acute PT needs identified. Ambulating with high level dynamic ability, no loss of balance, navigates stairs. Normal DGI scores indicating low fall risk. All education has been completed and the patient has no further questions.  See below for any follow-up Physical Therapy or equipment needs. PT is signing off. Thank you for this referral.     Follow Up Recommendations No PT follow up    Equipment Recommendations  None recommended by PT    Recommendations for Other Services       Precautions / Restrictions Precautions Precautions: None Restrictions Weight Bearing Restrictions: No      Mobility  Bed Mobility Overal bed mobility: Modified Independent                  Transfers Overall transfer level: Modified independent                  Ambulation/Gait Ambulation/Gait assistance: Modified independent (Device/Increase time) Gait Distance (Feet): 275 Feet Assistive device: None Gait Pattern/deviations: WFL(Within Functional Limits) Gait velocity: slightly decreased   General Gait Details: slightly decreased gait speed, no instability with high level challenges  Stairs Stairs: Yes Stairs assistance: Modified independent (Device/Increase time) Stair Management: One rail Right Number of Stairs: 13 General stair comments: no issues  Wheelchair Mobility    Modified Rankin (Stroke Patients Only) Modified Rankin (Stroke Patients Only) Pre-Morbid Rankin Score: No symptoms Modified Rankin: No symptoms     Balance Overall balance assessment: No apparent balance deficits (not formally  assessed)                               Standardized Balance Assessment Standardized Balance Assessment : Dynamic Gait Index   Dynamic Gait Index Level Surface: Normal Change in Gait Speed: Normal Gait with Horizontal Head Turns: Normal Gait with Vertical Head Turns: Normal Gait and Pivot Turn: Normal Step Over Obstacle: Normal Step Around Obstacles: Normal Steps: Mild Impairment Total Score: 23       Pertinent Vitals/Pain Pain Assessment: No/denies pain    Home Living Family/patient expects to be discharged to:: Private residence Living Arrangements: Spouse/significant other (no animals) Available Help at Discharge: Family;Available 24 hours/day (daughter live 2 miles away) Type of Home: House Home Access: Stairs to enter Entrance Stairs-Rails: Lawyer of Steps: 5 Home Layout: One level Home Equipment: Adaptive equipment Additional Comments: self employeed, real estate    Prior Function Level of Independence: Independent               Hand Dominance   Dominant Hand: Right    Extremity/Trunk Assessment   Upper Extremity Assessment Upper Extremity Assessment: Overall WFL for tasks assessed    Lower Extremity Assessment Lower Extremity Assessment: Overall WFL for tasks assessed    Cervical / Trunk Assessment Cervical / Trunk Assessment: Normal  Communication   Communication: No difficulties  Cognition Arousal/Alertness: Awake/alert Behavior During Therapy: WFL for tasks assessed/performed Overall Cognitive Status: Within Functional Limits for tasks assessed  General Comments General comments (skin integrity, edema, etc.): Orthostatics: Supine BP 172/80 HR 57; Seated BP 161/82 HR 64; Standing BP 148/86 79 HR - after ambulating at end of therapy session BP 170/86 HR 80 (ASYMPTOMATIC)    Exercises     Assessment/Plan    PT Assessment Patent does not need  any further PT services  PT Problem List         PT Treatment Interventions      PT Goals (Current goals can be found in the Care Plan section)  Acute Rehab PT Goals Patient Stated Goal: to return home PT Goal Formulation: All assessment and education complete, DC therapy    Frequency     Barriers to discharge        Co-evaluation               AM-PAC PT "6 Clicks" Mobility  Outcome Measure Help needed turning from your back to your side while in a flat bed without using bedrails?: None Help needed moving from lying on your back to sitting on the side of a flat bed without using bedrails?: None Help needed moving to and from a bed to a chair (including a wheelchair)?: None Help needed standing up from a chair using your arms (e.g., wheelchair or bedside chair)?: None Help needed to walk in hospital room?: None Help needed climbing 3-5 steps with a railing? : None 6 Click Score: 24    End of Session   Activity Tolerance: Patient tolerated treatment well Patient left: in bed;with call bell/phone within reach;with family/visitor present   PT Visit Diagnosis: Other symptoms and signs involving the nervous system (F79.024)    Time: 0973-5329 PT Time Calculation (min) (ACUTE ONLY): 20 min   Charges:   PT Evaluation $PT Eval Low Complexity: 1 Low          Charlsie Merles, PT, DPT  [place] Mount 04/16/2020, 11:23 AM

## 2020-04-16 NOTE — ED Notes (Signed)
Report called and given to Vonna Kotyk, Charity fundraiser.

## 2020-04-16 NOTE — Progress Notes (Signed)
SLP Cancellation Note  Patient Details Name: Morgan Maldonado MRN: [id number] DOB: 17-Sep-1955   Cancelled treatment:       Reason Eval/Treat Not Completed: SLP screened, no needs identified, will sign off   Per discussion with PT/OT/patient and family, all deficits have resolved in setting of TIA and they have no concerns for speech/language/cognition/swallowing. Pt to discharge within the hour. Should any concerns arise after going home, recommend outpatient speech evaluation.  Shavelle Runkel P. Breianna Delfino, M.S., CCC-SLP Speech-Language Pathologist Acute Rehabilitation Services Pager: [phone]  Susanne Borders Kalijah Zeiss 04/16/2020, 2:25 PM

## 2020-04-16 NOTE — Progress Notes (Signed)
Patient discharged home with family via wheelchair. AVS documentation printed and reviewed with pt and family.

## 2020-04-16 NOTE — ED Provider Notes (Signed)
[facility] EMERGENCY DEPARTMENT Provider Note  CSN: [id number] Arrival date & time: 04/15/20 2042  Chief Complaint(s) Numbness  HPI Morgan Maldonado is a 65 y.o. female who presents to the emergency department for sudden episode of left face left arm and left leg numbness and tingling that lasted approximately 5 minutes and completely resolved.  This occurred around 830 this evening.  No prior episodes in the past.  No associated chest pain or shortness of breath.  No dizziness or headache.  No weakness or facial droop.  No slurred speech.  No other physical complaints.  Patient has a history of hypertension but is not taking blood pressure medicine.  He states that she was previously placed on medication 1 to 2 years ago but never took it.  HPI  Past Medical History Past Medical History:  Diagnosis Date  . Chicken pox    as a child  . Hypertension    Patient Active Problem List   Diagnosis Date Noted  . TIA (transient ischemic attack) 04/16/2020  . Hypertensive urgency 04/16/2020  . Sinus bradycardia 04/16/2020  . Cough due to ACE inhibitor 10/18/2011  . Encounter for preventive health examination 06/14/2011  . Hypertension 06/14/2011  . OBESITY 09/26/2008  . ELEVATED BLOOD PRESSURE WITHOUT DIAGNOSIS OF HYPERTENSION 09/26/2008   Home Medication(s) Prior to Admission medications   Medication Sig Start Date End Date Taking? Authorizing Provider  triamterene-hydrochlorothiazide (MAXZIDE-25) 37.5-25 MG tablet TAKE 1 TABLET BY MOUTH EVERY DAY 07/20/18   Panosh, [name], MD                                                                                                                                    Past Surgical History Past Surgical History:  Procedure Laterality Date  . bx right breast benign fibroadenoma    . miscarriages     1998-1999 tubal pg  . right tube and ovary     remove tubal pregnancy 1999   Family History Family History  Problem Relation Age of  Onset  . Cancer Mother   . Hypertension Mother   . Diabetes Mother   . Diabetes Father   . Gout Sister   . Healthy Brother     Social History Social History   Tobacco Use  . Smoking status: Former Smoker    Years: 9.00    Types: Cigarettes  . Smokeless tobacco: Never Used  Substance Use Topics  . Alcohol use: No  . Drug use: No   Allergies Codeine, Hydrocodone, Penicillins, Lisinopril, and Lotrel [amlodipine besy-benazepril hcl]  Review of Systems Review of Systems All other systems are reviewed and are negative for acute change except as noted in the HPI  Physical Exam Vital Signs  I have reviewed the triage vital signs BP (!) 216/91 (BP Location: Right Arm)   Pulse (!) 59   Temp 98 F (36.7 C)   Resp 16   Ht   (1.626 m)   Wt 86.2 kg   SpO2 99%   BMI 32.61 kg/m   Physical Exam Vitals reviewed.  Constitutional:      General: She is not in acute distress.    Appearance: She is well-developed. She is not diaphoretic.  HENT:     Head: Normocephalic and atraumatic.     Nose: Nose normal.  Eyes:     General: No scleral icterus.       Right eye: No discharge.        Left eye: No discharge.     Conjunctiva/sclera: Conjunctivae normal.     Pupils: Pupils are equal, round, and reactive to light.  Cardiovascular:     Rate and Rhythm: Normal rate and regular rhythm.     Heart sounds: No murmur heard. No friction rub. No gallop.   Pulmonary:     Effort: Pulmonary effort is normal. No respiratory distress.     Breath sounds: Normal breath sounds. No stridor. No rales.  Abdominal:     General: There is no distension.     Palpations: Abdomen is soft.     Tenderness: There is no abdominal tenderness.  Musculoskeletal:        General: No tenderness.     Cervical back: Normal range of motion and neck supple.  Skin:    General: Skin is warm and dry.     Findings: No erythema or rash.  Neurological:     Mental Status: She is alert and oriented to person,  place, and time.     Comments: Mental Status:  Alert and oriented to person, place, and time.  Attention and concentration normal.  Speech clear.  Recent memory is intact  Cranial Nerves:  II Visual Fields: Intact to confrontation. Visual fields intact. III, IV, VI: Pupils equal and reactive to light and near. Full eye movement without nystagmus  V Facial Sensation: Normal. No weakness of masticatory muscles  VII: No facial weakness or asymmetry  VIII Auditory Acuity: Grossly normal  IX/X: The uvula is midline; the palate elevates symmetrically  XI: Normal sternocleidomastoid and trapezius strength  XII: The tongue is midline. No atrophy or fasciculations.   Motor System: Muscle Strength: 5/5 and symmetric in the upper and lower extremities. No pronation or drift.  Muscle Tone: Tone and muscle bulk are normal in the upper and lower extremities.   Coordination: No tremor.  Sensation: Intact to light touch.  Gait: Routine  gait normal.      ED Results and Treatments Labs (all labs ordered are listed, but only abnormal results are displayed) Labs Reviewed  URINALYSIS, ROUTINE W REFLEX MICROSCOPIC - Abnormal; Notable for the following components:      Result Value   Color, Urine STRAW (*)    Leukocytes,Ua MODERATE (*)    Bacteria, UA RARE (*)    Non Squamous Epithelial 0-5 (*)    All other components within normal limits  SARS CORONAVIRUS 2 (TAT 6-24 HRS)  URINE CULTURE  BASIC METABOLIC PANEL  CBC  HIV ANTIBODY (ROUTINE TESTING W REFLEX)  HEMOGLOBIN A1C  LIPID PANEL  TSH  CBG MONITORING, ED  EKG  EKG Interpretation  Date/Time:  Saturday April 15 2020 21:07:15 EDT Ventricular Rate:  65 PR Interval:  224 QRS Duration: 82 QT Interval:  396 QTC Calculation: 411 R Axis:   35 Text Interpretation: Sinus rhythm with 1st degree A-V block Septal infarct ,  age undetermined Abnormal ECG No old tracing to compare Confirmed by Drema Pry [phone]) on 04/15/2020 11:20:11 PM      Radiology CT HEAD WO CONTRAST  Result Date: 04/16/2020 CLINICAL DATA:  Numbness and tingling EXAM: CT HEAD WITHOUT CONTRAST TECHNIQUE: Contiguous axial images were obtained from the base of the skull through the vertex without intravenous contrast. COMPARISON:  None. FINDINGS: Brain: There is no mass, hemorrhage or extra-axial collection. The size and configuration of the ventricles and extra-axial CSF spaces are normal. The brain parenchyma is normal, without acute or chronic infarction. Vascular: No abnormal hyperdensity of the major intracranial arteries or dural venous sinuses. No intracranial atherosclerosis. Skull: The visualized skull base, calvarium and extracranial soft tissues are normal. Sinuses/Orbits: No fluid levels or advanced mucosal thickening of the visualized paranasal sinuses. No mastoid or middle ear effusion. The orbits are normal. IMPRESSION: Normal head CT. Electronically Signed   By: Deatra Robinson M.D.   On: 04/16/2020 02:01    Pertinent labs & imaging results that were available during my care of the patient were reviewed by me and considered in my medical decision making (see chart for details).  Medications Ordered in ED Medications   stroke: mapping our early stages of recovery book (has no administration in time range)  acetaminophen (TYLENOL) tablet 650 mg (has no administration in time range)    Or  acetaminophen (TYLENOL) 160 MG/5ML solution 650 mg (has no administration in time range)    Or  acetaminophen (TYLENOL) suppository 650 mg (has no administration in time range)  enoxaparin (LOVENOX) injection 40 mg (has no administration in time range)  aspirin tablet 325 mg (has no administration in time range)                                                                                                                                     Procedures Procedures  (including critical care time)  Medical Decision Making / ED Course I have reviewed the nursing notes for this encounter and the patient's prior records (if available in EHR or on provided paperwork).   Morgan Maldonado was evaluated in Emergency Department on 04/16/2020 for the symptoms described in the history of present illness. She was evaluated in the context of the global COVID-19 pandemic, which necessitated consideration that the patient might be at risk for infection with the SARS-CoV-2 virus that causes COVID-19. Institutional protocols and algorithms that pertain to the evaluation of patients at risk for COVID-19 are in a state of rapid change based on information released by regulatory bodies including the CDC and federal and state organizations. These policies and algorithms were followed  during the patient's care in the ED.  Left-sided numbness and tingling. Completely resolved. No focal deficits on exam. Likely TIA. No electrolyte derangements or renal sufficiency. CT head negative for mass-effect or ICH. No chest pain or shortness of breath concerning for heart damage from blood pressure.  BP is improving without intervention down to the 180s. Admitted to medicine for TIA work-up.      Final Clinical Impression(s) / ED Diagnoses Final diagnoses:  TIA (transient ischemic attack)      This chart was dictated using voice recognition software.  Despite best efforts to proofread,  errors can occur which can change the documentation meaning.   Nira Conn, MD 04/16/20 [phone]

## 2020-04-16 NOTE — Progress Notes (Signed)
PT Cancellation Note  Patient Details Name: Morgan Maldonado MRN: [id number] DOB: 29-Mar-1955   Cancelled Treatment:    Reason Eval/Treat Not Completed: Patient at procedure or test/unavailable Pt off unit for testing. Will follow up later today for evaluation.  Berton Mount 04/16/2020, 10:04 AM

## 2020-04-16 NOTE — Consult Note (Addendum)
Neurology Consultation  Reason for Consult: Transient left hemibody numbness with hypertensive emergency Referring Physician: Dr. Nelson Chimes  CC: Transient left hemibody numbness with hypertensive emergency  History is obtained from: Patient, Patient husband at bedside, Chart review  HPI: Morgan Maldonado is a 65 y.o. female with a medical history significant for hypertension (not on antihypertensive medication for > 1 year), remote tobacco use, and obesity- BMI 32.61 who presented to the ED 4/9 for evaluation of left hemibody numbness and heaviness. She states that after using the restroom, when she stood up she noticed her left face, lips, arm, and leg were numb with some tingling and a heavy feeling with walking. She states she was not dragging her left leg but it did feel somewhat heavy with ambulation. She drank some water and went outside for some fresh air when she called her daughter. The numbness resolved in approximately 2 minutes but she was advised by her daughter to check her blood pressure and got multiple readings of a systolic blood pressure over 200 mmHg prompting her to go to the ED for further evaluation and treatment.   LKW: 20:00 4/9  TPA: No, symptoms were transient lasting approximately 2 minutes and resolved prior to hospital arrival.   ROS: A complete ROS was performed and is negative except as noted in the HPI.   Past Medical History:  Diagnosis Date  . Chicken pox    as a child  . Hypertension    Past Surgical History:  Procedure Laterality Date  . bx right breast benign fibroadenoma    . miscarriages     1998-1999 tubal pg  . right tube and ovary     remove tubal pregnancy 1999   Family History  Problem Relation Age of Onset  . Cancer Mother   . Hypertension Mother   . Diabetes Mother   . Diabetes Father   . Gout Sister   . Healthy Brother    Social History:   reports that she has quit smoking. Her smoking use included cigarettes. She quit after 9.00 years of  use. She has never used smokeless tobacco. She reports that she does not drink alcohol and does not use drugs.  Medications  Current Facility-Administered Medications:  .  acetaminophen (TYLENOL) tablet 650 mg, 650 mg, Oral, Q4H PRN **OR** acetaminophen (TYLENOL) 160 MG/5ML solution 650 mg, 650 mg, Per Tube, Q4H PRN **OR** acetaminophen (TYLENOL) suppository 650 mg, 650 mg, Rectal, Q4H PRN, John Giovanni, MD .  aspirin tablet 325 mg, 325 mg, Oral, Daily, John Giovanni, MD, 325 mg at 04/16/20 0435 .  enoxaparin (LOVENOX) injection 40 mg, 40 mg, Subcutaneous, Daily, John Giovanni, MD  Exam: Current vital signs: BP (!) 180/62 (BP Location: Left Arm)   Pulse (!) 57   Temp 98.3 F (36.8 C) (Oral)   Resp 17   Ht 5\' 4"  (1.626 m)   Wt 86.2 kg   SpO2 99%   BMI 32.61 kg/m  Vital signs in last 24 hours: Temp:  [98 F (36.7 C)-98.3 F (36.8 C)] 98.3 F (36.8 C) (04/10 0409) Pulse Rate:  [56-59] 57 (04/10 0409) Resp:  [16-18] 17 (04/10 0409) BP: (180-216)/(62-91) 180/62 (04/10 0409) SpO2:  [99 %-100 %] 99 % (04/10 0409) Weight:  [86.2 kg] 86.2 kg (04/09 2055)  GENERAL: Awake, alert in no acute distress Head: Normocephalic and atraumatic, dry mm EENT: No OP obstruction, normal conjunctivae LUNGS: Normal respiratory effort, non-labored breathing CV: Extremities warm, without edema, bradycardia on cardiac monitor ABDOMEN:  Soft,  nontender Ext: warm, well perfused, without obvious deformity  NEURO:  Mental Status: Alert and oriented to self, place, age, month, year, and situation. She is able to provide a clear and coherent history of present illness. Speech/Language: speech is intact without dysarthria or aphasia.  Naming, repetition, fluency, and comprehension are intact. No neglect is noted. Cranial Nerves:  II: PERRL 3 mm/brisk. Visual fields are full.  III, IV, VI: EOMI. Lid elevation symmetric and full.  V: Sensation is intact to light touch and symmetrical to  face. VII: Face is symmetric resting and smiling. Able to puff cheeks and raise eyebrows.  VIII: Hearing is intact to voice. IX, X: Palate elevation is symmetric. Phonation normal.  XI: Normal sternocleidomastoid and trapezius muscle strength XII: Tongue protrudes midline without fasciculations.   Motor: 5/5 strength is all muscle groups without vertical drift. Tone is normal. Bulk is normal.  Sensation: Intact to light touch bilaterally in all four extremities. No extinction to DSS present.  Coordination: FTN intact bilaterally. HKS intact bilaterally. No pronator drift.   DTRs: 2+ and symmetric patellae and biceps. Gait- deferred  NIHSS: 0  Labs I have reviewed labs in epic and the results pertinent to this consultation are: CBC    Component Value Date/Time   WBC 7.2 04/15/2020 2101   RBC 4.84 04/15/2020 2101   HGB 12.8 04/15/2020 2101   HCT 40.0 04/15/2020 2101   PLT 288 04/15/2020 2101   MCV 82.6 04/15/2020 2101   MCH 26.4 04/15/2020 2101   MCHC 32.0 04/15/2020 2101   RDW 13.2 04/15/2020 2101   LYMPHSABS 2.5 01/08/2018 0903   MONOABS 0.6 01/08/2018 0903   EOSABS 0.1 01/08/2018 0903   BASOSABS 0.0 01/08/2018 0903   CMP     Component Value Date/Time   NA 136 04/15/2020 2101   K 4.0 04/15/2020 2101   CL 102 04/15/2020 2101   CO2 27 04/15/2020 2101   GLUCOSE 91 04/15/2020 2101   BUN 14 04/15/2020 2101   CREATININE 0.80 04/15/2020 2101   CALCIUM 8.9 04/15/2020 2101   PROT 7.7 01/08/2018 0903   ALBUMIN 4.2 01/08/2018 0903   AST 19 01/08/2018 0903   ALT 20 01/08/2018 0903   ALKPHOS 97 01/08/2018 0903   BILITOT 0.3 01/08/2018 0903   GFRNONAA >60 04/15/2020 2101   Lipid Panel     Component Value Date/Time   CHOL 256 (H) 04/16/2020 0456   TRIG 166 (H) 04/16/2020 0456   HDL 51 04/16/2020 0456   CHOLHDL 5.0 04/16/2020 0456   VLDL 33 04/16/2020 0456   LDLCALC 172 (H) 04/16/2020 0456   LDLDIRECT 122.7 06/14/2011 1054   Lab Results  Component Value Date   HGBA1C  6.3 (H) 04/16/2020   Imaging I have reviewed the images obtained: CT-scan of the brain: Normal head CT.  CT angio head and neck:  1. No emergent finding. 2. High-grade right P2 segment stenosis. 3. 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 Left MCA flow is primarily from the posterior circulation.  MRI examination of the brain: 1. Age normal appearance of the brain. 2. Small left ICA, likely developmental. There is pending CTA of the head and neck.  Echocardiogram IMPRESSIONS  1. Left ventricular ejection fraction, by estimation, is >75%. The left  ventricle has hyperdynamic function. The left ventricle has no regional  wall motion abnormalities. Left ventricular diastolic parameters were  normal.  2. Right ventricular systolic function is normal. The right ventricular  size is normal.  3. The  mitral valve is grossly normal. No evidence of mitral valve  regurgitation. No evidence of mitral stenosis.  4. The aortic valve is normal in structure. Aortic valve regurgitation is  not visualized. No aortic stenosis is present.     Assessment: 65 year old female with history as above who presents for evaluation of transient left hemibody numbness that lasted approximately 2 minutes at home with a blood pressure of 216/91. - Examination reveals resolution of prior numbness without other identified neurological deficits and an NIHSS of 0. - CT and MRI without acute infarct but with a small left ICA, likely developmental (left-sided symptoms). CTA with evidence of high grade right P2 segment stenosis, likely contributing to left-sided symptoms-right thalamic TIA.  - Stroke work up reveals an elevated LDL at 172, hemoglobin A1c of 6.3%, and hypertensive emergency with a blood pressure on arrival of 216/91. - Patient's stroke risk factors include uncontrolled hypertension with hypertensive emergency on arrival, hyperlipidemia, obesity, and remote tobacco use.  -  Presentation most consistent with cerebral hypoperfusion / ischemia without infarct. TTE completed. EF >75%, no cardiac source of thrombus.  Impression: Likely right thalamic TIA given right PCA stenosis Evaluate for underlying abnormal cardiac rhythm with an outpatient monitor  Recommendations: - Initiate high-intensity statin therapy, Atorvastatin due to LDL of 172 - Allow for permissive hypertension for the first 24-48h - only treat PRN if SBP >220 mmHg. Blood pressures can be gradually normalized to SBP<140 upon discharge. - Frequent neuro checks - Prophylactic therapy- Antiplatelet med: Aspirin 81 mg daily with clopidogrel 75 mg daily for 3 weeks followed by aspirin monotherapy 81 mg daily.  Patient reluctant for DAPT. Doing ASA and statin only. - Risk factor modification - Telemetry monitoring - PT consult, OT consult, Speech consult -30-day outpatient cardiac monitoring Follow-up with outpatient neurology clinic in 4 to 6 weeks at [facility] neurology stroke clinic  Pt seen by NP/Neuro and later by MD. Note/plan to be edited by MD as needed.  Lanae Boast, AGAC-NP [facility] Pager: [phone]  Attending Neurohospitalist Addendum Patient seen and examined with APP/Resident. Agree with the history and physical as documented above. Agree with the plan as documented, which I helped formulate. I have independently reviewed the chart, obtained history, review of systems and examined the patient.I have personally reviewed pertinent head/neck/spine imaging (CT/MRI). Please feel free to call with any questions. --- Milon Dikes, MD [facility] Pager: [phone]  If 7pm to 7am, please call on call as listed on AMION.

## 2020-04-16 NOTE — Plan of Care (Signed)
  Problem: Education: Goal: Knowledge of secondary prevention will improve 04/16/2020 1627 by Merrilee Seashore, RN Outcome: Adequate for Discharge 04/16/2020 1434 by Merrilee Seashore, RN Outcome: Progressing Goal: Knowledge of patient specific risk factors addressed and post discharge goals established will improve 04/16/2020 1627 by Merrilee Seashore, RN Outcome: Adequate for Discharge 04/16/2020 1434 by Merrilee Seashore, RN Outcome: Progressing Goal: Individualized Educational Video(s) 04/16/2020 1627 by Merrilee Seashore, RN Outcome: Adequate for Discharge 04/16/2020 1434 by Merrilee Seashore, RN Outcome: Progressing   Problem: Health Behavior/Discharge Planning: Goal: Ability to manage health-related needs will improve 04/16/2020 1627 by Merrilee Seashore, RN Outcome: Adequate for Discharge 04/16/2020 1434 by Merrilee Seashore, RN Outcome: Progressing

## 2020-04-16 NOTE — Progress Notes (Signed)
OT Cancellation Note  Patient Details Name: Morgan Maldonado MRN: [id number] DOB: April 23, 1955   Cancelled Treatment:    Reason Eval/Treat Not Completed: Patient at procedure or test/ unavailable (CT) Ot to reattempt after CT   Wynona Neat, OTR/L  Acute Rehabilitation Services Pager: [phone] Office: [phone] .  04/16/2020, 8:34 AM

## 2020-04-16 NOTE — Progress Notes (Signed)
  Echocardiogram 2D Echocardiogram has been performed.  Morgan Maldonado 04/16/2020, 2:16 PM

## 2020-04-16 NOTE — Plan of Care (Signed)
  Problem: Education: Goal: Knowledge of secondary prevention will improve Outcome: Progressing Goal: Knowledge of patient specific risk factors addressed and post discharge goals established will improve Outcome: Progressing Goal: Individualized Educational Video(s) Outcome: Progressing   Problem: Education: Goal: Individualized Educational Video(s) Outcome: Progressing   Problem: Health Behavior/Discharge Planning: Goal: Ability to manage health-related needs will improve Outcome: Progressing

## 2020-04-16 NOTE — Evaluation (Signed)
Occupational Therapy Evaluation Patient Details Name: Morgan Maldonado MRN: [id number] DOB: 1955/01/18 Today's Date: 04/16/2020    History of Present Illness 65yo female admitted with L side numbness htn emergency and bradycardic that she reports has resolved. PMH chicken pox HTn obesity   Clinical Impression   Patient evaluated by Occupational Therapy with no further acute OT needs identified. All education has been completed and the patient has no further questions. See below for any follow-up Occupational Therapy or equipment needs. OT to sign off. Thank you for referral.   Educated on stroke booklet including signs/ symptoms and also on keeping record of BP in a notebook at home with set times. Spouse present for all education.     Follow Up Recommendations  No OT follow up    Equipment Recommendations  None recommended by OT    Recommendations for Other Services       Precautions / Restrictions Precautions Precautions: None      Mobility Bed Mobility Overal bed mobility: Modified Independent                  Transfers Overall transfer level: Modified independent                    Balance Overall balance assessment: No apparent balance deficits (not formally assessed)                                         ADL either performed or assessed with clinical judgement   ADL Overall ADL's : Modified independent                                       General ADL Comments: completed toilet, LB dressing and sink level grooming. pt with visual scanning task and reading board .     Vision Baseline Vision/History: Wears glasses Wears Glasses: Reading only Vision Assessment?: No apparent visual deficits     Perception     Praxis      Pertinent Vitals/Pain Pain Assessment: No/denies pain     Hand Dominance Right   Extremity/Trunk Assessment Upper Extremity Assessment Upper Extremity Assessment: Overall WFL for tasks  assessed   Lower Extremity Assessment Lower Extremity Assessment: Overall WFL for tasks assessed   Cervical / Trunk Assessment Cervical / Trunk Assessment: Normal   Communication Communication Communication: No difficulties   Cognition Arousal/Alertness: Awake/alert Behavior During Therapy: WFL for tasks assessed/performed Overall Cognitive Status: Within Functional Limits for tasks assessed                                     General Comments  Orthostatics: Supine BP 172/80 HR 57; Seated BP 161/82 HR 64; Standing BP 148/86 79 HR - after ambulating at end of therapy session BP 170/86 HR 80 (ASYMPTOMATIC)    Exercises     Shoulder Instructions      Home Living Family/patient expects to be discharged to:: Private residence Living Arrangements: Spouse/significant other (no animals) Available Help at Discharge: Family;Available 24 hours/day (daughter live 2 miles away) Type of Home: House Home Access: Stairs to enter [employer] of Steps: 5 Entrance Stairs-Rails: Left;Right Home Layout: One level     Bathroom Shower/Tub: Chief Strategy Officer:  Standard     Home Equipment: Adaptive equipment Adaptive Equipment: Reacher Additional Comments: self employeed, real estate      Prior Functioning/Environment Level of Independence: Independent                 OT Problem List:        OT Treatment/Interventions:      OT Goals(Current goals can be found in the care plan section) Acute Rehab OT Goals Patient Stated Goal: to return home  OT Frequency:     Barriers to D/C:            Co-evaluation              AM-PAC OT "6 Clicks" Daily Activity     Outcome Measure Help from another person eating meals?: None Help from another person taking care of personal grooming?: None Help from another person toileting, which includes using toliet, bedpan, or urinal?: None Help from another person bathing (including washing,  rinsing, drying)?: None Help from another person to put on and taking off regular upper body clothing?: None Help from another person to put on and taking off regular lower body clothing?: None 6 Click Score: 24   End of Session Nurse Communication: Mobility status;Precautions  Activity Tolerance: Patient tolerated treatment well Patient left: in bed;with call bell/phone within reach;with bed alarm set  OT Visit Diagnosis: Muscle weakness (generalized) (M62.81)                Time: 6144-3154 OT Time Calculation (min): 27 min Charges:  OT General Charges $OT Visit: 1 Visit OT Evaluation $OT Eval Moderate Complexity: 1 Mod   Brynn, OTR/L  Acute Rehabilitation Services Pager: [phone] Office: [phone] .   Mateo Flow 04/16/2020, 10:51 AM

## 2020-04-16 NOTE — Discharge Summary (Addendum)
Physician Discharge Summary  Lexington Devine JXB:[id number] DOB: 04-29-1955 DOA: 04/15/2020  PCP: Madelin Headings, MD  Admit date: 04/15/2020 Discharge date: 04/16/2020  Admitted From: Home Disposition: Home  Recommendations for Outpatient Follow-up:  1. Follow up with PCP in 1-2 weeks 2. Please obtain BMP/CBC in one week 3. Please follow up on the following pending results: None  Home Health: No Equipment/Devices: None Discharge Condition: Stable CODE STATUS: Full Diet recommendation: Heart Healthy / Carb Modified   Brief/Interim Summary: Morgan Maldonado is a 64 y.o. female with medical history significant of hypertension, obesity (BMI 32.61), former tobacco use presented to the ED with complaints of left-sided numbness and tingling.  Symptoms resolved prior to arrival to the ED.  Afebrile.  Slightly bradycardic with heart rate in the 50s.  Blood pressure 216/91 on arrival.  CBC and BMP unremarkable.  UA with negative nitrite, moderate amount of leukocytes, 6-10 WBCs, and rare bacteria.  Head CT normal.  MRI brain without any acute abnormality.  Neurology was also consulted and she completed the stroke work-up.  Most likely had TIA with markedly elevated blood pressure.  Patient was not compliant with her home antihypertensives as she does not want to take pills.  She was counseled extensively about good control of hypertension.  Lipid panel with elevated total cholesterol, triglyceride and LDL.  Goal LDL is less than 70. Blood pressure did showed some improvement with restarting home Maxide. A1c was 6.3 which makes her prediabetic.  ASCVD risk of 22% which can be decreased to 4% with risk modification. Echocardiogram was within normal limit.  She was started on low-dose aspirin, Crestor and advised to continue with current antihypertensive and follow-up with primary care provider for further recommendations about blood pressure control.  Discharge Diagnoses:  Principal Problem:   TIA (transient  ischemic attack) Active Problems:   OBESITY   Hypertensive urgency   Sinus bradycardia   Discharge Instructions  Discharge Instructions    Diet - low sodium heart healthy   Complete by: As directed    Discharge instructions   Complete by: As directed    It was pleasure taking care of you. As we discussed it is important that you keep your blood pressure well controlled.  Based on your blood pressure and cholesterol your risk for getting any stroke or heart attack is 22% which can be decreased to less than 4% by controlling these parameters. It is important that you watch your diet as you are also becoming prediabetic which can further increase your risk.  By controlling your diet, losing weight and exercising you can decrease your risk.  It is important that you take your medications regularly for further risk reduction.   Keep yourself well-hydrated and follow-up with your primary care doctor closely for better control of your blood pressure.   Increase activity slowly   Complete by: As directed      Allergies as of 04/16/2020      Reactions   Codeine Nausea And Vomiting   Hydrocodone Nausea And Vomiting   Penicillins    Lisinopril Cough   Dry cough  With med.    Lotrel [amlodipine Besy-benazepril Hcl] Nausea Only   Took for one week.      Medication List    TAKE these medications   aspirin 81 MG EC tablet Take 1 tablet (81 mg total) by mouth daily. Swallow whole. Start taking on: April 17, 2020   rosuvastatin 10 MG tablet Commonly known as: CRESTOR Take 1 tablet (10  mg total) by mouth daily. Start taking on: April 17, 2020   triamterene-hydrochlorothiazide 37.5-25 MG tablet Commonly known as: MAXZIDE-25 TAKE 1 TABLET BY MOUTH EVERY DAY       Follow-up Information    Panosh, [name], MD. Schedule an appointment as soon as possible for a visit in 1 week(s).   Specialties: Internal Medicine, Pediatrics Contact information: [address] [place]  [address] [phone]              Allergies  Allergen Reactions  . Codeine Nausea And Vomiting  . Hydrocodone Nausea And Vomiting  . Penicillins   . Lisinopril Cough    Dry cough  With med.   Rica Mast [Amlodipine Besy-Benazepril Hcl] Nausea Only    Took for one week.    Consultations:  Neurology  Procedures/Studies: CT Angio Head W or Wo Contrast  Result Date: 04/16/2020 CLINICAL DATA:  Stroke workup EXAM: CT ANGIOGRAPHY HEAD AND NECK TECHNIQUE: Multidetector CT imaging of the head and neck was performed using the standard protocol during bolus administration of intravenous contrast. Multiplanar CT image reconstructions and MIPs were obtained to evaluate the vascular anatomy. Carotid stenosis measurements (when applicable) are obtained utilizing NASCET criteria, using the distal internal carotid diameter as the denominator. CONTRAST:  66mL OMNIPAQUE IOHEXOL 350 MG/ML SOLN COMPARISON:  Head CT and brain MRI from earlier today FINDINGS: CTA NECK FINDINGS Aortic arch: 3 vessel branching. Right carotid system: Vessels are smooth and widely patent. No atheromatous changes. 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 There is a low bifurcation of the left carotid. No atheromatous changes. Vertebral arteries: No proximal subclavian stenosis. Mild atheromatous scratch the atheromatous plaque at the left vertebral origin without stenosis or ulceration. Negative for beading Skeleton: Lower cervical spondylosis/disc degeneration. Other neck: No acute inflammation. Upper chest: Biapical scar-like appearance.  No acute finding Review of the MIP images confirms the above findings CTA HEAD FINDINGS Anterior circulation: Small left ICA is noted below. Normal size left MCA due to large left posterior communicating artery. Aplastic left A1 segment. No branch occlusion, beading, or aneurysm. Posterior circulation: Codominant vertebral arteries. High-grade right P2 segment  narrowing, presumably atheromatous. Widely patent basilar. No major branch occlusion. Venous sinuses: Normal Anatomic variants: As above Review of the MIP images confirms the above findings IMPRESSION: 1. No emergent finding. 2. High-grade right P2 segment stenosis. 3. 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 Left MCA flow is primarily from the posterior circulation. Electronically Signed   By: Marnee Spring M.D.   On: 04/16/2020 09:10   CT HEAD WO CONTRAST  Result Date: 04/16/2020 CLINICAL DATA:  Numbness and tingling EXAM: CT HEAD WITHOUT CONTRAST TECHNIQUE: Contiguous axial images were obtained from the base of the skull through the vertex without intravenous contrast. COMPARISON:  None. FINDINGS: Brain: There is no mass, hemorrhage or extra-axial collection. The size and configuration of the ventricles and extra-axial CSF spaces are normal. The brain parenchyma is normal, without acute or chronic infarction. Vascular: No abnormal hyperdensity of the major intracranial arteries or dural venous sinuses. No intracranial atherosclerosis. Skull: The visualized skull base, calvarium and extracranial soft tissues are normal. Sinuses/Orbits: No fluid levels or advanced mucosal thickening of the visualized paranasal sinuses. No mastoid or middle ear effusion. The orbits are normal. IMPRESSION: Normal head CT. Electronically Signed   By: Deatra Robinson M.D.   On: 04/16/2020 02:01   CT Angio Neck W and/or Wo Contrast  Result Date: 04/16/2020  CLINICAL DATA:  Stroke workup EXAM: CT ANGIOGRAPHY HEAD AND NECK TECHNIQUE: Multidetector CT imaging of the head and neck was performed using the standard protocol during bolus administration of intravenous contrast. Multiplanar CT image reconstructions and MIPs were obtained to evaluate the vascular anatomy. Carotid stenosis measurements (when applicable) are obtained utilizing NASCET criteria, using the distal internal carotid diameter as the  denominator. CONTRAST:  55mL OMNIPAQUE IOHEXOL 350 MG/ML SOLN COMPARISON:  Head CT and brain MRI from earlier today FINDINGS: CTA NECK FINDINGS Aortic arch: 3 vessel branching. Right carotid system: Vessels are smooth and widely patent. No atheromatous changes. 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 There is a low bifurcation of the left carotid. No atheromatous changes. Vertebral arteries: No proximal subclavian stenosis. Mild atheromatous scratch the atheromatous plaque at the left vertebral origin without stenosis or ulceration. Negative for beading Skeleton: Lower cervical spondylosis/disc degeneration. Other neck: No acute inflammation. Upper chest: Biapical scar-like appearance.  No acute finding Review of the MIP images confirms the above findings CTA HEAD FINDINGS Anterior circulation: Small left ICA is noted below. Normal size left MCA due to large left posterior communicating artery. Aplastic left A1 segment. No branch occlusion, beading, or aneurysm. Posterior circulation: Codominant vertebral arteries. High-grade right P2 segment narrowing, presumably atheromatous. Widely patent basilar. No major branch occlusion. Venous sinuses: Normal Anatomic variants: As above Review of the MIP images confirms the above findings IMPRESSION: 1. No emergent finding. 2. High-grade right P2 segment stenosis. 3. 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 Left MCA flow is primarily from the posterior circulation. Electronically Signed   By: Marnee Spring M.D.   On: 04/16/2020 09:10   MR BRAIN WO CONTRAST  Result Date: 04/16/2020 CLINICAL DATA:  TIA EXAM: MRI HEAD WITHOUT CONTRAST TECHNIQUE: Multiplanar, multiecho pulse sequences of the brain and surrounding structures were obtained without intravenous contrast. COMPARISON:  Head CT from earlier today FINDINGS: Brain: No acute infarction, hemorrhage, hydrocephalus, extra-axial collection or mass lesion. Few  remote white matter insults, age congruent. Normal brain volume Vascular: Small left ICA flow void. Small carotid canal by CT suggests this is developmental. There is pending CTA of the head and neck. A large left posterior communicating artery is present with symmetric MCA flow voids. Skull and upper cervical spine: Normal marrow signal Sinuses/Orbits: Negative IMPRESSION: 1. Age normal appearance of the brain. 2. Small left ICA, likely developmental. There is pending CTA of the head and neck. Electronically Signed   By: Marnee Spring M.D.   On: 04/16/2020 07:29    Subjective: Patient was seen and examined today.  No complaint.  Wants to go home.  When asked she was not taking her antihypertensives, stating that she normally does not want to take any medicine.  Had a long discussion regarding keeping blood pressure under good control and how to avoid any cardiovascular event.  Patient agrees to start taking her blood pressure medication and to add aspirin and statin.  We also discussed about watching diet, regular exercise and losing some weight.  Husband at bedside.  Discharge Exam: Vitals:   04/16/20 0906 04/16/20 1143  BP: (!) 170/81 (!) 174/72  Pulse: (!) 53 (!) 54  Resp: 18 18  Temp: 98 F (36.7 C) 97.9 F (36.6 C)  SpO2: 100% 99%   Vitals:   04/16/20 0234 04/16/20 0409 04/16/20 0906 04/16/20 1143  BP: (!) 186/87 (!) 180/62 (!) 170/81 (!) 174/72  Pulse: (!) 56 (!) 57 (!) 53 Marland Kitchen)  54  Resp: 18 17 18 18   Temp:  98.3 F (36.8 C) 98 F (36.7 C) 97.9 F (36.6 C)  TempSrc:  Oral Oral Oral  SpO2: 100% 99% 100% 99%  Weight:      Height:        General: Pt is alert, awake, not in acute distress Cardiovascular: RRR, S1/S2 +, no rubs, no gallops Respiratory: CTA bilaterally, no wheezing, no rhonchi Abdominal: Soft, NT, ND, bowel sounds + Extremities: no edema, no cyanosis   The results of significant diagnostics from this hospitalization (including imaging, microbiology, ancillary  and laboratory) are listed below for reference.    Microbiology: Recent Results (from the past 240 hour(s))  SARS CORONAVIRUS 2 (TAT 6-24 HRS) Nasopharyngeal Nasopharyngeal Swab     Status: None   Collection Time: 04/16/20  2:55 AM   Specimen: Nasopharyngeal Swab  Result Value Ref Range Status   SARS Coronavirus 2 NEGATIVE NEGATIVE Final    Comment: (NOTE) SARS-CoV-2 target nucleic acids are NOT DETECTED.  The SARS-CoV-2 RNA is generally detectable in upper and lower respiratory specimens during the acute phase of infection. Negative results do not preclude SARS-CoV-2 infection, do not rule out co-infections with other pathogens, and should not be used as the sole basis for treatment or other patient management decisions. Negative results must be combined with clinical observations, patient history, and epidemiological information. The expected result is Negative.  Fact Sheet for Patients: 06/16/20  Fact Sheet for Healthcare Providers: HairSlick.no  This test is not yet approved or cleared by the [place] FDA and  has been authorized for detection and/or diagnosis of SARS-CoV-2 by FDA under an Emergency Use Authorization (EUA). This EUA will remain  in effect (meaning this test can be used) for the duration of the COVID-19 declaration under Se ction 564(b)(1) of the Act, 21 U.S.C. section 360bbb-3(b)(1), unless the authorization is terminated or revoked sooner.  Performed at [facility], [address], [place], [place]      Labs: BNP (last 3 results) No results for input(s): BNP in the last 8760 hours. Basic Metabolic Panel: Recent Labs  Lab 04/15/20 2101  NA 136  K 4.0  CL 102  CO2 27  GLUCOSE 91  BUN 14  CREATININE 0.80  CALCIUM 8.9   Liver Function Tests: No results for input(s): AST, ALT, ALKPHOS, BILITOT, PROT, ALBUMIN in the last 168 hours. No results for input(s):  LIPASE, AMYLASE in the last 168 hours. No results for input(s): AMMONIA in the last 168 hours. CBC: Recent Labs  Lab 04/15/20 2101  WBC 7.2  HGB 12.8  HCT 40.0  MCV 82.6  PLT 288   Cardiac Enzymes: No results for input(s): CKTOTAL, CKMB, CKMBINDEX, TROPONINI in the last 168 hours. BNP: Invalid input(s): POCBNP CBG: Recent Labs  Lab 04/15/20 2133  GLUCAP 93   D-Dimer No results for input(s): DDIMER in the last 72 hours. Hgb A1c Recent Labs    04/16/20 0456  HGBA1C 6.3*   Lipid Profile Recent Labs    04/16/20 0456  CHOL 256*  HDL 51  LDLCALC 172*  TRIG 166*  CHOLHDL 5.0   Thyroid function studies Recent Labs    04/16/20 1014  TSH 3.389   Anemia work up No results for input(s): VITAMINB12, FOLATE, FERRITIN, TIBC, IRON, RETICCTPCT in the last 72 hours. Urinalysis    Component Value Date/Time   COLORURINE STRAW (A) 04/15/2020 2057   APPEARANCEUR CLEAR 04/15/2020 2057   LABSPEC 1.008 04/15/2020 2057  PHURINE 7.0 04/15/2020 2057   GLUCOSEU NEGATIVE 04/15/2020 2057   HGBUR NEGATIVE 04/15/2020 2057   BILIRUBINUR NEGATIVE 04/15/2020 2057   BILIRUBINUR n 06/14/2011 1102   KETONESUR NEGATIVE 04/15/2020 2057   PROTEINUR NEGATIVE 04/15/2020 2057   UROBILINOGEN 0.2 06/14/2011 1102   NITRITE NEGATIVE 04/15/2020 2057   LEUKOCYTESUR MODERATE (A) 04/15/2020 2057   Sepsis Labs Invalid input(s): PROCALCITONIN,  WBC,  LACTICIDVEN Microbiology Recent Results (from the past 240 hour(s))  SARS CORONAVIRUS 2 (TAT 6-24 HRS) Nasopharyngeal Nasopharyngeal Swab     Status: None   Collection Time: 04/16/20  2:55 AM   Specimen: Nasopharyngeal Swab  Result Value Ref Range Status   SARS Coronavirus 2 NEGATIVE NEGATIVE Final    Comment: (NOTE) SARS-CoV-2 target nucleic acids are NOT DETECTED.  The SARS-CoV-2 RNA is generally detectable in upper and lower respiratory specimens during the acute phase of infection. Negative results do not preclude SARS-CoV-2 infection, do  not rule out co-infections with other pathogens, and should not be used as the sole basis for treatment or other patient management decisions. Negative results must be combined with clinical observations, patient history, and epidemiological information. The expected result is Negative.  Fact Sheet for Patients: HairSlick.nohttps://www.fda.gov/media/138098/download  Fact Sheet for Healthcare Providers: quierodirigir.comhttps://www.fda.gov/media/138095/download  This test is not yet approved or cleared by the [place] FDA and  has been authorized for detection and/or diagnosis of SARS-CoV-2 by FDA under an Emergency Use Authorization (EUA). This EUA will remain  in effect (meaning this test can be used) for the duration of the COVID-19 declaration under Se ction 564(b)(1) of the Act, 21 U.S.C. section 360bbb-3(b)(1), unless the authorization is terminated or revoked sooner.  Performed at [facility], [address] [place], [place] [address]     Time coordinating discharge: Over 30 minutes  SIGNED:  Arnetha CourserSumayya Kamira Mellette, MD  [facility] 04/16/2020, 2:51 PM  If 7PM-7AM, please contact night-coverage [email or web address]  This record has been created using Conservation officer, historic buildingsDragon voice recognition software. Errors have been sought and corrected,but may not always be located. Such creation errors do not reflect on the standard of care.

## 2020-04-16 NOTE — H&P (Signed)
History and Physical    Morgan Maldonado VVZ:[id number] DOB: 1955/03/18 DOA: 04/15/2020  PCP: Madelin Headings, MD Patient coming from: Home  Chief Complaint: Left-sided numbness and tingling  HPI: Morgan Maldonado is a 65 y.o. female with medical history significant of hypertension, obesity (BMI 32.61), former tobacco use presented to the ED with complaints of left-sided numbness and tingling.  Symptoms resolved prior to arrival to the ED.  Afebrile.  Slightly bradycardic with heart rate in the 50s.  Blood pressure 216/91 on arrival.  CBC and BMP unremarkable.  UA with negative nitrite, moderate amount of leukocytes, 6-10 WBCs, and rare bacteria.  Head CT normal. Patient was given her home triamterene-hydrochlorothiazide and blood pressure subsequently improved.  Neurology consulted.  Patient states around 8 PM tonight she experienced acute onset left-sided facial, arm, and leg numbness and tingling.  This happened after she ate some bean sprouts with soy sauce.  Symptoms resolved in 2 minutes.  She called her daughter on the phone and was able to speak clearly at that time.  She then checked her blood pressure and it was high in the 180s.  She did not have any change in her vision, headache, dizziness, or weakness in her arm or leg.  States she stopped taking her blood pressure medication a year ago as she wanted to lead a holistic lifestyle.  She has not been checking her blood pressure regularly.  She has no other complaints.  Denies fevers, cough, shortness breath, chest pain, nausea, vomiting, abdominal pain, diarrhea, dysuria, or urinary frequency/urgency.  She is vaccinated against COVID but has not received a booster yet.  Review of Systems:  All systems reviewed and apart from history of presenting illness, are negative.  Past Medical History:  Diagnosis Date  . Chicken pox    as a child  . Hypertension     Past Surgical History:  Procedure Laterality Date  . bx right breast benign fibroadenoma     . miscarriages     1998-1999 tubal pg  . right tube and ovary     remove tubal pregnancy 1999     reports that she has quit smoking. Her smoking use included cigarettes. She quit after 9.00 years of use. She has never used smokeless tobacco. She reports that she does not drink alcohol and does not use drugs.  Allergies  Allergen Reactions  . Codeine Nausea And Vomiting  . Hydrocodone Nausea And Vomiting  . Penicillins   . Lisinopril Cough    Dry cough  With med.   Rica Mast [Amlodipine Besy-Benazepril Hcl] Nausea Only    Took for one week.    Family History  Problem Relation Age of Onset  . Cancer Mother   . Hypertension Mother   . Diabetes Mother   . Diabetes Father   . Gout Sister   . Healthy Brother     Prior to Admission medications   Medication Sig Start Date End Date Taking? Authorizing Provider  triamterene-hydrochlorothiazide (MAXZIDE-25) 37.5-25 MG tablet TAKE 1 TABLET BY MOUTH EVERY DAY 07/20/18   Panosh, [name], MD    Physical Exam: Vitals:   04/15/20 2049 04/15/20 2055 04/16/20 0234  BP: (!) 216/91  (!) 186/87  Pulse: (!) 59  (!) 56  Resp: 16  18  Temp: 98 F (36.7 C)    SpO2: 99%  100%  Weight:  86.2 kg   Height:  5\' 4"  (1.626 m)     Physical Exam Constitutional:  General: She is not in acute distress. HENT:     Head: Normocephalic and atraumatic.  Eyes:     Extraocular Movements: Extraocular movements intact.     Conjunctiva/sclera: Conjunctivae normal.  Cardiovascular:     Rate and Rhythm: Normal rate and regular rhythm.     Pulses: Normal pulses.  Pulmonary:     Effort: Pulmonary effort is normal. No respiratory distress.     Breath sounds: Normal breath sounds. No wheezing or rales.  Abdominal:     General: Bowel sounds are normal. There is no distension.     Palpations: Abdomen is soft.     Tenderness: There is no abdominal tenderness.  Musculoskeletal:        General: No swelling or tenderness.     Cervical back: Normal  range of motion and neck supple.  Skin:    General: Skin is warm and dry.  Neurological:     General: No focal deficit present.     Mental Status: She is alert and oriented to person, place, and time.     Cranial Nerves: No cranial nerve deficit.     Sensory: No sensory deficit.     Motor: No weakness.     Labs on Admission: I have personally reviewed following labs and imaging studies  CBC: Recent Labs  Lab 04/15/20 2101  WBC 7.2  HGB 12.8  HCT 40.0  MCV 82.6  PLT 288   Basic Metabolic Panel: Recent Labs  Lab 04/15/20 2101  NA 136  K 4.0  CL 102  CO2 27  GLUCOSE 91  BUN 14  CREATININE 0.80  CALCIUM 8.9   GFR: Estimated Creatinine Clearance: 76.5 mL/min (by C-G formula based on SCr of 0.8 mg/dL). Liver Function Tests: No results for input(s): AST, ALT, ALKPHOS, BILITOT, PROT, ALBUMIN in the last 168 hours. No results for input(s): LIPASE, AMYLASE in the last 168 hours. No results for input(s): AMMONIA in the last 168 hours. Coagulation Profile: No results for input(s): INR, PROTIME in the last 168 hours. Cardiac Enzymes: No results for input(s): CKTOTAL, CKMB, CKMBINDEX, TROPONINI in the last 168 hours. BNP (last 3 results) No results for input(s): PROBNP in the last 8760 hours. HbA1C: No results for input(s): HGBA1C in the last 72 hours. CBG: Recent Labs  Lab 04/15/20 2133  GLUCAP 93   Lipid Profile: No results for input(s): CHOL, HDL, LDLCALC, TRIG, CHOLHDL, LDLDIRECT in the last 72 hours. Thyroid Function Tests: No results for input(s): TSH, T4TOTAL, FREET4, T3FREE, THYROIDAB in the last 72 hours. Anemia Panel: No results for input(s): VITAMINB12, FOLATE, FERRITIN, TIBC, IRON, RETICCTPCT in the last 72 hours. Urine analysis:    Component Value Date/Time   COLORURINE STRAW (A) 04/15/2020 2057   APPEARANCEUR CLEAR 04/15/2020 2057   LABSPEC 1.008 04/15/2020 2057   PHURINE 7.0 04/15/2020 2057   GLUCOSEU NEGATIVE 04/15/2020 2057   HGBUR NEGATIVE  04/15/2020 2057   BILIRUBINUR NEGATIVE 04/15/2020 2057   BILIRUBINUR n 06/14/2011 1102   KETONESUR NEGATIVE 04/15/2020 2057   PROTEINUR NEGATIVE 04/15/2020 2057   UROBILINOGEN 0.2 06/14/2011 1102   NITRITE NEGATIVE 04/15/2020 2057   LEUKOCYTESUR MODERATE (A) 04/15/2020 2057    Radiological Exams on Admission: CT HEAD WO CONTRAST  Result Date: 04/16/2020 CLINICAL DATA:  Numbness and tingling EXAM: CT HEAD WITHOUT CONTRAST TECHNIQUE: Contiguous axial images were obtained from the base of the skull through the vertex without intravenous contrast. COMPARISON:  None. FINDINGS: Brain: There is no mass, hemorrhage or extra-axial collection. The size  and configuration of the ventricles and extra-axial CSF spaces are normal. The brain parenchyma is normal, without acute or chronic infarction. Vascular: No abnormal hyperdensity of the major intracranial arteries or dural venous sinuses. No intracranial atherosclerosis. Skull: The visualized skull base, calvarium and extracranial soft tissues are normal. Sinuses/Orbits: No fluid levels or advanced mucosal thickening of the visualized paranasal sinuses. No mastoid or middle ear effusion. The orbits are normal. IMPRESSION: Normal head CT. Electronically Signed   By: Deatra Robinson M.D.   On: 04/16/2020 02:01    EKG: Independently reviewed.  Sinus rhythm with first-degree AV block.  Septal infarct, age indeterminate.  No prior tracing for comparison.  Assessment/Plan Principal Problem:   TIA (transient ischemic attack) Active Problems:   OBESITY   Hypertensive urgency   Sinus bradycardia   Concern for TIA Patient presenting with complaints of acute onset left-sided facial, arm, and leg numbness and tingling.  Symptoms resolved in 2 minutes prior to ED arrival.  Neuro exam nonfocal.  Head CT normal.  However, does have stroke risk factors including uncontrolled hypertension. -Neurology consulted -Telemetry monitoring -MRI of the brain without  contrast -CTA head and neck pending -TTE  -Hemoglobin A1c, fasting lipid panel -Aspirin 325 p.o. now and daily -Frequent neurochecks -PT, OT, speech therapy. -N.p.o. until cleared by bedside swallow evaluation or formal speech evaluation  Hypertensive urgency Secondary to medication noncompliance.  Her home medication regimen includes triamterene-hydrochlorothiazide but patient states she stopped taking it a year ago.  Blood pressure significantly elevated with systolic in the 200s on arrival.  She was given a dose of her home triamterene-hydrochlorothiazide in the ED and blood pressure now improved. -Avoid additional antihypertensives at this time until brain MRI rules out acute stroke  Sinus bradycardia Slightly bradycardic with heart rate in the 50s to 60s.  Not on any AV nodal blocking agent.  EKG showing sinus rhythm with first-degree AV block. -Cardiac monitoring.  Check TSH level.  Avoid AV nodal blocking agents.  Abnormal urinalysis UA with negative nitrite, moderate amount of leukocytes, 6-10 WBCs, and rare bacteria.  Patient is not endorsing UTI symptoms. No fever or leukocytosis. -Add on urine culture  Obesity (BMI 32.61) -Encourage lifestyle modifications such as healthy diet and exercise.  DVT prophylaxis: Lovenox Code Status: Full code Family Communication: No family available at this time. Disposition Plan: Status is: Observation  The patient remains OBS appropriate and will d/c before 2 midnights.  Dispo: The patient is from: Home              Anticipated d/c is to: Home              Patient currently is not medically stable to d/c.   Difficult to place patient No  Level of care: Level of care: Telemetry Medical   The medical decision making on this patient was of high complexity and the patient is at high risk for clinical deterioration, therefore this is a level 3 visit.  John Giovanni MD [facility]  If 7PM-7AM, please contact  night-coverage [email or web address]  04/16/2020, 3:19 AM

## 2020-04-17 NOTE — Progress Notes (Addendum)
Chief Complaint  Patient presents with  . Hospitalization Follow-up    HPI: Morgan Maldonado 65 y.o. come in for " fu  Hosp"  Last visit with me was video   March 2020  For ht and was supposed to have fu in summer  2020 didn't happen   Presented to ed with  Sided numbness felt to be  tia and high bp   Not taking med because  Chose not to take any pills   Readings in 170 180 range  Mri brain nad  Did last long less than 15 minutes   bp was very high 190 range and went to ED.  Had TIA evaluation which was fortunately negative treated for her hypertension upon discharge and placed on statin medication.  Her last dose of medicine was 3 days ago because they did not give her a prescription to take at home.  She had been taking medicine fairly regularly and blood pressure readings a few months ago were in the 130s over 70s but more recently under very extreme stress and may have stopped taking the medicine.  ROS: See pertinent positives and negatives per HPI.  No current chest pain shortness of breath syncope neurologic symptoms.  Is aware she should really lose weight Negative TAD.  Past Medical History:  Diagnosis Date  . Chicken pox    as a child  . Hypertension     Family History  Problem Relation Age of Onset  . Cancer Mother   . Hypertension Mother   . Diabetes Mother   . Diabetes Father   . Gout Sister   . Healthy Brother     Social History   Socioeconomic History  . Marital status: Married    Spouse name: Not on file  . Number of children: 1  . Years of education: 4415  . Highest education level: Not on file  Occupational History  . Occupation: Copywriter, advertisingBroker    Employer: [employer]  Tobacco Use  . Smoking status: Former Smoker    Years: 9.00    Types: Cigarettes  . Smokeless tobacco: Never Used  Substance and Sexual Activity  . Alcohol use: No  . Drug use: No  . Sexual activity: Not on file  Other Topics Concern  . Not on file  Social History Narrative   Married self  employed real estate   About  50 minutes.   College degree   hh of 2   No pets             Social Determinants of Corporate investment bankerHealth   Financial Resource Strain: Not on file  Food Insecurity: Not on file  Transportation Needs: Not on file  Physical Activity: Not on file  Stress: Not on file  Social Connections: Not on file    Outpatient Medications Prior to Visit  Medication Sig Dispense Refill  . aspirin EC 81 MG EC tablet Take 1 tablet (81 mg total) by mouth daily. Swallow whole. 30 tablet 11  . rosuvastatin (CRESTOR) 10 MG tablet Take 1 tablet (10 mg total) by mouth daily. 90 tablet 1  . triamterene-hydrochlorothiazide (MAXZIDE-25) 37.5-25 MG tablet TAKE 1 TABLET BY MOUTH EVERY DAY 30 tablet 5   No facility-administered medications prior to visit.     EXAM:  BP 140/68 (BP Location: Left Arm, Patient Position: Sitting, Cuff Size: Large)   Pulse 74   Temp 98.1 F (36.7 C) (Oral)   Ht 5\' 4"  (1.626 m)   Wt 193 lb 9.6 oz (87.8  kg)   SpO2 97%   BMI 33.23 kg/m   Body mass index is 33.23 kg/m. Pressure readings recently have been 151/73 140/73 156/85 and 1 was 173 GENERAL: vitals reviewed and listed above, alert, oriented, appears well hydrated and in no acute distress HEENT: atraumatic, conjunctiva  clear, no obvious abnormalities on inspection of external nose and ears OP : mask   NECK: no obvious masses on inspection palpation  LUNGS: clear to auscultation bilaterally, no wheezes, rales or rhonchi, good air movement CV: HRRR, no clubbing cyanosis or  peripheral edema nl cap refill  MS: moves all extremities without noticeable focal  abnormality PSYCH: pleasant and cooperative, no obvious depression or anxiety Lab Results  Component Value Date   WBC 7.2 04/15/2020   HGB 12.8 04/15/2020   HCT 40.0 04/15/2020   PLT 288 04/15/2020   GLUCOSE 91 04/15/2020   CHOL 256 (H) 04/16/2020   TRIG 166 (H) 04/16/2020   HDL 51 04/16/2020   LDLDIRECT 122.7 06/14/2011   LDLCALC 172 (H)  04/16/2020   ALT 20 01/08/2018   AST 19 01/08/2018   NA 136 04/15/2020   K 4.0 04/15/2020   CL 102 04/15/2020   CREATININE 0.80 04/15/2020   BUN 14 04/15/2020   CO2 27 04/15/2020   TSH 3.389 04/16/2020   HGBA1C 6.3 (H) 04/16/2020   BP Readings from Last 3 Encounters:  04/19/20 140/68  04/16/20 (!) 163/84  01/08/18 (!) 152/82    ASSESSMENT AND PLAN:  Discussed the following assessment and plan:  Hospital discharge follow-up  Primary hypertension - Plan: Lipid panel, Basic metabolic panel, Hemoglobin A1c, Lipid panel, Hepatic function panel, Basic metabolic panel, Hemoglobin A1c  Hyperlipidemia, unspecified hyperlipidemia type - Plan: Lipid panel, Basic metabolic panel, Hemoglobin A1c, Lipid panel, Hepatic function panel, Basic metabolic panel, Hemoglobin A1c  Hyperglycemia - Plan: Lipid panel, Basic metabolic panel, Hemoglobin A1c, Lipid panel, Hepatic function panel, Basic metabolic panel, Hemoglobin A1c  Medication management - Plan: Lipid panel, Hepatic function panel, Basic metabolic panel, Hemoglobin A1c  History of transient ischemic attack (TIA) - Plan: Lipid panel, Hepatic function panel, Basic metabolic panel, Hemoglobin A1c  Stress Pressure is improving may need to add on medicine but dramatically down from highest.  Will monitor consider adding medication (cannot take ACEs)  Record review counseling evaluation medication plan ahead 45 minutes. -Patient advised to return or notify health care team  if  new concerns arise.  Patient Instructions   continue medication .  BP goal    is 130/80 and below   Plan lab in July  And ROV    But need  BP fu  In about 4 weeks .  Send in readings   BP  in about 2 weeks   Or so  My chart or call or  Other .     [email or web address]">  DASH Eating Plan DASH stands for Dietary Approaches to Stop Hypertension. The DASH eating plan is a healthy eating plan that has been shown  to:  Reduce high blood pressure (hypertension).  Reduce your risk for type 2 diabetes, heart disease, and stroke.  Help with weight loss. What are tips for following this plan? Reading food labels  Check food labels for the amount of salt (sodium) per serving. Choose foods with less than 5 percent of the Daily Value of sodium. Generally, foods with less than 300 milligrams (mg) of sodium per serving fit into this eating plan.  To find whole grains, look for the word "whole" as  the first word in the ingredient list. Shopping  Buy products labeled as "low-sodium" or "no salt added."  Buy fresh foods. Avoid canned foods and pre-made or frozen meals. Cooking  Avoid adding salt when cooking. Use salt-free seasonings or herbs instead of table salt or sea salt. Check with your health care provider or pharmacist before using salt substitutes.  Do not fry foods. Cook foods using healthy methods such as baking, boiling, grilling, roasting, and broiling instead.  Cook with heart-healthy oils, such as olive, canola, avocado, soybean, or sunflower oil. Meal planning  Eat a balanced diet that includes: ? 4 or more servings of fruits and 4 or more servings of vegetables each day. Try to fill one-half of your plate with fruits and vegetables. ? 6-8 servings of whole grains each day. ? Less than 6 oz (170 g) of lean meat, poultry, or fish each day. A 3-oz (85-g) serving of meat is about the same size as a deck of cards. One egg equals 1 oz (28 g). ? 2-3 servings of low-fat dairy each day. One serving is 1 cup (237 mL). ? 1 serving of nuts, seeds, or beans 5 times each week. ? 2-3 servings of heart-healthy fats. Healthy fats called omega-3 fatty acids are found in foods such as walnuts, flaxseeds, fortified milks, and eggs. These fats are also found in cold-water fish, such as sardines, salmon, and mackerel.  Limit how much you eat of: ? Canned or prepackaged foods. ? Food that is high in trans  fat, such as some fried foods. ? Food that is high in saturated fat, such as fatty meat. ? Desserts and other sweets, sugary drinks, and other foods with added sugar. ? Full-fat dairy products.  Do not salt foods before eating.  Do not eat more than 4 egg yolks a week.  Try to eat at least 2 vegetarian meals a week.  Eat more home-cooked food and less restaurant, buffet, and fast food.   Lifestyle  When eating at a restaurant, ask that your food be prepared with less salt or no salt, if possible.  If you drink alcohol: ? Limit how much you use to:  0-1 drink a day for women who are not pregnant.  0-2 drinks a day for men. ? Be aware of how much alcohol is in your drink. In the [place] one drink equals one 12 oz bottle of beer (355 mL), one 5 oz glass of wine (148 mL), or one 1 oz glass of hard liquor (44 mL). General information  Avoid eating more than 2,300 mg of salt a day. If you have hypertension, you may need to reduce your sodium intake to 1,500 mg a day.  Work with your health care provider to maintain a healthy body weight or to lose weight. Ask what an ideal weight is for you.  Get at least 30 minutes of exercise that causes your heart to beat faster (aerobic exercise) most days of the week. Activities may include walking, swimming, or biking.  Work with your health care provider or dietitian to adjust your eating plan to your individual calorie needs. What foods should I eat? Fruits All fresh, dried, or frozen fruit. Canned fruit in natural juice (without added sugar). Vegetables Fresh or frozen vegetables (raw, steamed, roasted, or grilled). Low-sodium or reduced-sodium tomato and vegetable juice. Low-sodium or reduced-sodium tomato sauce and tomato paste. Low-sodium or reduced-sodium canned vegetables. Grains Whole-grain or whole-wheat bread. Whole-grain or whole-wheat pasta. [place] rice. Orpah Cobb. Bulgur.  Whole-grain and low-sodium cereals. Pita bread. Low-fat,  low-sodium crackers. Whole-wheat flour tortillas. Meats and other proteins Skinless chicken or [place]. Ground chicken or [place]. Pork with fat trimmed off. Fish and seafood. Egg whites. Dried beans, peas, or lentils. Unsalted nuts, nut butters, and seeds. Unsalted canned beans. Lean cuts of beef with fat trimmed off. Low-sodium, lean precooked or cured meat, such as sausages or meat loaves. Dairy Low-fat (1%) or fat-free (skim) milk. Reduced-fat, low-fat, or fat-free cheeses. Nonfat, low-sodium ricotta or cottage cheese. Low-fat or nonfat yogurt. Low-fat, low-sodium cheese. Fats and oils Soft margarine without trans fats. Vegetable oil. Reduced-fat, low-fat, or light mayonnaise and salad dressings (reduced-sodium). Canola, safflower, olive, avocado, soybean, and sunflower oils. Avocado. Seasonings and condiments Herbs. Spices. Seasoning mixes without salt. Other foods Unsalted popcorn and pretzels. Fat-free sweets. The items listed above may not be a complete list of foods and beverages you can eat. Contact a dietitian for more information. What foods should I avoid? Fruits Canned fruit in a light or heavy syrup. Fried fruit. Fruit in cream or butter sauce. Vegetables Creamed or fried vegetables. Vegetables in a cheese sauce. Regular canned vegetables (not low-sodium or reduced-sodium). Regular canned tomato sauce and paste (not low-sodium or reduced-sodium). Regular tomato and vegetable juice (not low-sodium or reduced-sodium). Rosita Fire. Olives. Grains Baked goods made with fat, such as croissants, muffins, or some breads. Dry pasta or rice meal packs. Meats and other proteins Fatty cuts of meat. Ribs. Fried meat. Tomasa Blase. Bologna, salami, and other precooked or cured meats, such as sausages or meat loaves. Fat from the back of a pig (fatback). Bratwurst. Salted nuts and seeds. Canned beans with added salt. Canned or smoked fish. Whole eggs or egg yolks. Chicken or [place] with skin. Dairy Whole  or 2% milk, cream, and half-and-half. Whole or full-fat cream cheese. Whole-fat or sweetened yogurt. Full-fat cheese. Nondairy creamers. Whipped toppings. Processed cheese and cheese spreads. Fats and oils Butter. Stick margarine. Lard. Shortening. Ghee. Bacon fat. Tropical oils, such as coconut, palm kernel, or palm oil. Seasonings and condiments Onion salt, garlic salt, seasoned salt, table salt, and sea salt. Worcestershire sauce. Tartar sauce. Barbecue sauce. Teriyaki sauce. Soy sauce, including reduced-sodium. Steak sauce. Canned and packaged gravies. Fish sauce. Oyster sauce. Cocktail sauce. Store-bought horseradish. Ketchup. Mustard. Meat flavorings and tenderizers. Bouillon cubes. Hot sauces. Pre-made or packaged marinades. Pre-made or packaged taco seasonings. Relishes. Regular salad dressings. Other foods Salted popcorn and pretzels. The items listed above may not be a complete list of foods and beverages you should avoid. Contact a dietitian for more information. Where to find more information  National Heart, Lung, and Blood Institute: PopSteam.is  American Heart Association: www.heart.org  Academy of Nutrition and Dietetics: www.eatright.org  National Kidney Foundation: www.kidney.org Summary  The DASH eating plan is a healthy eating plan that has been shown to reduce high blood pressure (hypertension). It may also reduce your risk for type 2 diabetes, heart disease, and stroke.  When on the DASH eating plan, aim to eat more fresh fruits and vegetables, whole grains, lean proteins, low-fat dairy, and heart-healthy fats.  With the DASH eating plan, you should limit salt (sodium) intake to 2,300 mg a day. If you have hypertension, you may need to reduce your sodium intake to 1,500 mg a day.  Work with your health care provider or dietitian to adjust your eating plan to your individual calorie needs. This information is not intended to replace advice given to you by your  health care provider.  Make sure you discuss any questions you have with your health care provider. Document Revised: 11/27/2018 Document Reviewed: 11/27/2018 Elsevier Patient Education  2021 [employer].     Steele. Rodric Punch M.D.   Admit date: 04/15/2020 Discharge date: 04/16/2020  Admitted From: Home Disposition: Home  Recommendations for Outpatient Follow-up:  1. Follow up with PCP in 1-2 weeks 2. Please obtain BMP/CBC in one week 3. Please follow up on the following pending results: None  Home Health: No Equipment/Devices: None Discharge Condition: Stable CODE STATUS: Full Diet recommendation: Heart Healthy / Carb Modified   Brief/Interim Summary: Morgan Maldonado a 65 y.o.femalewith medical history significant ofhypertension, obesity (BMI 32.61), former tobacco use presented to the ED with complaints of left-sided numbness and tingling. Symptoms resolved prior to arrival to the ED. Afebrile. Slightly bradycardic with heart rate in the 50s. Blood pressure 216/91 on arrival. CBC and BMP unremarkable. UA with negative nitrite, moderate amount of leukocytes, 6-10 WBCs, and rare bacteria. Head CT normal.  MRI brain without any acute abnormality.  Neurology was also consulted and she completed the stroke work-up.  Most likely had TIA with markedly elevated blood pressure.  Patient was not compliant with her home antihypertensives as she does not want to take pills.  She was counseled extensively about good control of hypertension.  Lipid panel with elevated total cholesterol, triglyceride and LDL.  Goal LDL is less than 70. Blood pressure did showed some improvement with restarting home Maxide. A1c was 6.3 which makes her prediabetic.  ASCVD risk of 22% which can be decreased to 4% with risk modification. Echocardiogram was within normal limit.  She was started on low-dose aspirin, Crestor and advised to continue with current antihypertensive and follow-up with primary care  provider for further recommendations about blood pressure control.  Discharge Diagnoses:  Principal Problem:   TIA (transient ischemic attack) Active Problems:   OBESITY   Hypertensive urgency   Sinus bradycardia  Additional review after visit  Neurology consult   Impression: Likely right thalamic TIA given right PCA stenosis Evaluate for underlying abnormal cardiac rhythm with an outpatient monitor  Recommendations: - Initiate high-intensity statin therapy, Atorvastatin due to LDL of 172 - Allow for permissive hypertension for the first 24-48h - only treat PRN if SBP >220 mmHg. Blood pressures can be gradually normalized to SBP<140 upon discharge. - Frequent neuro checks - Prophylactic therapy- Antiplatelet med: Aspirin 81 mg daily with clopidogrel 75 mg daily for 3 weeks followed by aspirin monotherapy 81 mg daily.  Patient reluctant for DAPT. Doing ASA and statin only. - Risk factor modification - Telemetry monitoring - PT consult, OT consult, Speech consult -30-day outpatient cardiac monitoring Follow-up with outpatient neurology clinic in 4 to 6 weeks at [facility]

## 2020-04-18 LAB — URINE CULTURE: Culture: >=100000 — AB

## 2020-04-19 ENCOUNTER — Other Ambulatory Visit: Payer: Self-pay

## 2020-04-19 ENCOUNTER — Ambulatory Visit (INDEPENDENT_AMBULATORY_CARE_PROVIDER_SITE_OTHER): Payer: 59 | Admitting: Internal Medicine

## 2020-04-19 ENCOUNTER — Encounter: Payer: Self-pay | Admitting: Internal Medicine

## 2020-04-19 VITALS — BP 140/68 | HR 74 | Temp 98.1°F | Ht 64.0 in | Wt 193.6 lb

## 2020-04-19 DIAGNOSIS — I10 Essential (primary) hypertension: Secondary | ICD-10-CM

## 2020-04-19 DIAGNOSIS — Z09 Encounter for follow-up examination after completed treatment for conditions other than malignant neoplasm: Secondary | ICD-10-CM

## 2020-04-19 DIAGNOSIS — F439 Reaction to severe stress, unspecified: Secondary | ICD-10-CM

## 2020-04-19 DIAGNOSIS — Z8673 Personal history of transient ischemic attack (TIA), and cerebral infarction without residual deficits: Secondary | ICD-10-CM

## 2020-04-19 DIAGNOSIS — Z79899 Other long term (current) drug therapy: Secondary | ICD-10-CM

## 2020-04-19 DIAGNOSIS — E785 Hyperlipidemia, unspecified: Secondary | ICD-10-CM | POA: Diagnosis not present

## 2020-04-19 DIAGNOSIS — R739 Hyperglycemia, unspecified: Secondary | ICD-10-CM | POA: Diagnosis not present

## 2020-04-19 MED ORDER — TRIAMTERENE-HCTZ 37.5-25 MG PO TABS: 1.0000 | ORAL_TABLET | Freq: Every day | ORAL | 1 refills | Status: DC

## 2020-04-19 NOTE — Patient Instructions (Addendum)
continue medication .  BP goal    is 130/80 and below   Plan lab in July  And ROV    But need  BP fu  In about 4 weeks .  Send in readings   BP  in about 2 weeks   Or so  My chart or call or  Other .     [email or web address]">  DASH Eating Plan DASH stands for Dietary Approaches to Stop Hypertension. The DASH eating plan is a healthy eating plan that has been shown to:  Reduce high blood pressure (hypertension).  Reduce your risk for type 2 diabetes, heart disease, and stroke.  Help with weight loss. What are tips for following this plan? Reading food labels  Check food labels for the amount of salt (sodium) per serving. Choose foods with less than 5 percent of the Daily Value of sodium. Generally, foods with less than 300 milligrams (mg) of sodium per serving fit into this eating plan.  To find whole grains, look for the word "whole" as the first word in the ingredient list. Shopping  Buy products labeled as "low-sodium" or "no salt added."  Buy fresh foods. Avoid canned foods and pre-made or frozen meals. Cooking  Avoid adding salt when cooking. Use salt-free seasonings or herbs instead of table salt or sea salt. Check with your health care provider or pharmacist before using salt substitutes.  Do not fry foods. Cook foods using healthy methods such as baking, boiling, grilling, roasting, and broiling instead.  Cook with heart-healthy oils, such as olive, canola, avocado, soybean, or sunflower oil. Meal planning  Eat a balanced diet that includes: ? 4 or more servings of fruits and 4 or more servings of vegetables each day. Try to fill one-half of your plate with fruits and vegetables. ? 6-8 servings of whole grains each day. ? Less than 6 oz (170 g) of lean meat, poultry, or fish each day. A 3-oz (85-g) serving of meat is about the same size as a deck of cards. One egg equals 1 oz (28 g). ? 2-3 servings of low-fat dairy each  day. One serving is 1 cup (237 mL). ? 1 serving of nuts, seeds, or beans 5 times each week. ? 2-3 servings of heart-healthy fats. Healthy fats called omega-3 fatty acids are found in foods such as walnuts, flaxseeds, fortified milks, and eggs. These fats are also found in cold-water fish, such as sardines, salmon, and mackerel.  Limit how much you eat of: ? Canned or prepackaged foods. ? Food that is high in trans fat, such as some fried foods. ? Food that is high in saturated fat, such as fatty meat. ? Desserts and other sweets, sugary drinks, and other foods with added sugar. ? Full-fat dairy products.  Do not salt foods before eating.  Do not eat more than 4 egg yolks a week.  Try to eat at least 2 vegetarian meals a week.  Eat more home-cooked food and less restaurant, buffet, and fast food.   Lifestyle  When eating at a restaurant, ask that your food be prepared with less salt or no salt, if possible.  If you drink alcohol: ? Limit how much you use to:  0-1 drink a day for women who are not pregnant.  0-2 drinks a day for men. ? Be aware of how much alcohol is in your drink. In the [place] one drink equals one 12 oz bottle of beer (355 mL), one 5 oz glass of wine (148  mL), or one 1 oz glass of hard liquor (44 mL). General information  Avoid eating more than 2,300 mg of salt a day. If you have hypertension, you may need to reduce your sodium intake to 1,500 mg a day.  Work with your health care provider to maintain a healthy body weight or to lose weight. Ask what an ideal weight is for you.  Get at least 30 minutes of exercise that causes your heart to beat faster (aerobic exercise) most days of the week. Activities may include walking, swimming, or biking.  Work with your health care provider or dietitian to adjust your eating plan to your individual calorie needs. What foods should I eat? Fruits All fresh, dried, or frozen fruit. Canned fruit in natural juice (without  added sugar). Vegetables Fresh or frozen vegetables (raw, steamed, roasted, or grilled). Low-sodium or reduced-sodium tomato and vegetable juice. Low-sodium or reduced-sodium tomato sauce and tomato paste. Low-sodium or reduced-sodium canned vegetables. Grains Whole-grain or whole-wheat bread. Whole-grain or whole-wheat pasta. Hinners rice. Orpah Cobb. Bulgur. Whole-grain and low-sodium cereals. Pita bread. Low-fat, low-sodium crackers. Whole-wheat flour tortillas. Meats and other proteins Skinless chicken or [place]. Ground chicken or [place]. Pork with fat trimmed off. Fish and seafood. Egg whites. Dried beans, peas, or lentils. Unsalted nuts, nut butters, and seeds. Unsalted canned beans. Lean cuts of beef with fat trimmed off. Low-sodium, lean precooked or cured meat, such as sausages or meat loaves. Dairy Low-fat (1%) or fat-free (skim) milk. Reduced-fat, low-fat, or fat-free cheeses. Nonfat, low-sodium ricotta or cottage cheese. Low-fat or nonfat yogurt. Low-fat, low-sodium cheese. Fats and oils Soft margarine without trans fats. Vegetable oil. Reduced-fat, low-fat, or light mayonnaise and salad dressings (reduced-sodium). Canola, safflower, olive, avocado, soybean, and sunflower oils. Avocado. Seasonings and condiments Herbs. Spices. Seasoning mixes without salt. Other foods Unsalted popcorn and pretzels. Fat-free sweets. The items listed above may not be a complete list of foods and beverages you can eat. Contact a dietitian for more information. What foods should I avoid? Fruits Canned fruit in a light or heavy syrup. Fried fruit. Fruit in cream or butter sauce. Vegetables Creamed or fried vegetables. Vegetables in a cheese sauce. Regular canned vegetables (not low-sodium or reduced-sodium). Regular canned tomato sauce and paste (not low-sodium or reduced-sodium). Regular tomato and vegetable juice (not low-sodium or reduced-sodium). Rosita Fire. Olives. Grains Baked goods made with fat,  such as croissants, muffins, or some breads. Dry pasta or rice meal packs. Meats and other proteins Fatty cuts of meat. Ribs. Fried meat. Tomasa Blase. Bologna, salami, and other precooked or cured meats, such as sausages or meat loaves. Fat from the back of a pig (fatback). Bratwurst. Salted nuts and seeds. Canned beans with added salt. Canned or smoked fish. Whole eggs or egg yolks. Chicken or [place] with skin. Dairy Whole or 2% milk, cream, and half-and-half. Whole or full-fat cream cheese. Whole-fat or sweetened yogurt. Full-fat cheese. Nondairy creamers. Whipped toppings. Processed cheese and cheese spreads. Fats and oils Butter. Stick margarine. Lard. Shortening. Ghee. Bacon fat. Tropical oils, such as coconut, palm kernel, or palm oil. Seasonings and condiments Onion salt, garlic salt, seasoned salt, table salt, and sea salt. Worcestershire sauce. Tartar sauce. Barbecue sauce. Teriyaki sauce. Soy sauce, including reduced-sodium. Steak sauce. Canned and packaged gravies. Fish sauce. Oyster sauce. Cocktail sauce. Store-bought horseradish. Ketchup. Mustard. Meat flavorings and tenderizers. Bouillon cubes. Hot sauces. Pre-made or packaged marinades. Pre-made or packaged taco seasonings. Relishes. Regular salad dressings. Other foods Salted popcorn and pretzels. The items listed above  may not be a complete list of foods and beverages you should avoid. Contact a dietitian for more information. Where to find more information  National Heart, Lung, and Blood Institute: PopSteam.is  American Heart Association: www.heart.org  Academy of Nutrition and Dietetics: www.eatright.org  National Kidney Foundation: www.kidney.org Summary  The DASH eating plan is a healthy eating plan that has been shown to reduce high blood pressure (hypertension). It may also reduce your risk for type 2 diabetes, heart disease, and stroke.  When on the DASH eating plan, aim to eat more fresh fruits and vegetables, whole  grains, lean proteins, low-fat dairy, and heart-healthy fats.  With the DASH eating plan, you should limit salt (sodium) intake to 2,300 mg a day. If you have hypertension, you may need to reduce your sodium intake to 1,500 mg a day.  Work with your health care provider or dietitian to adjust your eating plan to your individual calorie needs. This information is not intended to replace advice given to you by your health care provider. Make sure you discuss any questions you have with your health care provider. Document Revised: 11/27/2018 Document Reviewed: 11/27/2018 Elsevier Patient Education  2021 [employer].

## 2020-05-08 ENCOUNTER — Telehealth: Payer: Self-pay | Admitting: Internal Medicine

## 2020-05-08 NOTE — Telephone Encounter (Signed)
Pt is calling in to report her BP readings:    04/26/2020  133/76   9:00 a.m.        134/76   Around 3:00  04/27/2020   132/73  9:00 a.m.         131/73   2:00 p.m.  04/28/2020   133/76  9:00 a.m.          131/74   2:40 p.m.  04/29/2020   138/63  9:00 a.m.           134/67  3:40 p.m.  04/30/2020    137/63  9:00 a.m.         138/63   3:00 p.m.  05/01/2020   131/67   8:40 a.m.          131/67   12:30 p,m  05/02/2020    142/71  8:45 a.m. (late night popcorn) 139/79   2:30 p,m.    131/75  5:00 p.m.  05/03/2020    131/75  8:45 a.m. 131/75  3:45 p,m.             05/04/2020     130/79 9:00 a.m.            131/79  2:41 p.m  05/05/2020     131/79  9:00 a.m.            132/78   4:00 p,m.  05/06/2020      131/79  9:00 a.m            No charting due to conference  05/07/2020         132/78  8:40 a.m.           137/74  2:30 p.m.

## 2020-05-17 NOTE — Telephone Encounter (Signed)
Thank you for sending in your blood pressure readings. Most of them are acceptable. Eventually we would like to get down to closer to 120/80.  On average. Continue to monitor a few days a week if they are under 135/85 we can wait and see you at follow-up. Otherwise if they are creeping up we will add another medication to bring down your blood pressure.   Would probably add amlodipine 2.5 mg 1 a day

## 2020-05-18 NOTE — Telephone Encounter (Signed)
Left a message for the patient to return my call.  

## 2020-05-18 NOTE — Telephone Encounter (Signed)
Patient informed of the message below. Patient stated that she would not like to add medication and will try to improve lifestyle to bring her numbers down.

## 2020-05-30 ENCOUNTER — Other Ambulatory Visit: Payer: Self-pay | Admitting: Internal Medicine

## 2020-05-30 DIAGNOSIS — Z1231 Encounter for screening mammogram for malignant neoplasm of breast: Secondary | ICD-10-CM

## 2020-05-31 ENCOUNTER — Ambulatory Visit
Admission: RE | Admit: 2020-05-31 | Discharge: 2020-05-31 | Disposition: A | Discharge status: Home / Self Care | Payer: 59 | Source: Ambulatory Visit | Attending: Internal Medicine | Admitting: Internal Medicine

## 2020-05-31 ENCOUNTER — Other Ambulatory Visit: Payer: Self-pay

## 2020-05-31 DIAGNOSIS — Z1231 Encounter for screening mammogram for malignant neoplasm of breast: Secondary | ICD-10-CM

## 2020-07-19 ENCOUNTER — Other Ambulatory Visit: Payer: 59

## 2020-07-26 ENCOUNTER — Ambulatory Visit: Payer: 59 | Admitting: Internal Medicine

## 2020-08-18 ENCOUNTER — Other Ambulatory Visit (INDEPENDENT_AMBULATORY_CARE_PROVIDER_SITE_OTHER): Payer: Commercial Managed Care - PPO

## 2020-08-18 ENCOUNTER — Other Ambulatory Visit: Payer: Self-pay

## 2020-08-18 ENCOUNTER — Other Ambulatory Visit: Payer: 59

## 2020-08-18 DIAGNOSIS — E785 Hyperlipidemia, unspecified: Secondary | ICD-10-CM

## 2020-08-18 DIAGNOSIS — Z79899 Other long term (current) drug therapy: Secondary | ICD-10-CM | POA: Diagnosis not present

## 2020-08-18 DIAGNOSIS — R739 Hyperglycemia, unspecified: Secondary | ICD-10-CM | POA: Diagnosis not present

## 2020-08-18 DIAGNOSIS — I10 Essential (primary) hypertension: Secondary | ICD-10-CM | POA: Diagnosis not present

## 2020-08-18 DIAGNOSIS — Z8673 Personal history of transient ischemic attack (TIA), and cerebral infarction without residual deficits: Secondary | ICD-10-CM

## 2020-08-18 LAB — LIPID PANEL
Cholesterol: 231 mg/dL — ABNORMAL HIGH (ref 0–200)
HDL: 44 mg/dL (ref >39.00)
LDL Cholesterol: 154 mg/dL — ABNORMAL HIGH (ref 0–99)
NonHDL: 187.02
Total CHOL/HDL Ratio: 5
Triglycerides: 166 mg/dL — ABNORMAL HIGH (ref 0.0–149.0)
VLDL: 33.2 mg/dL (ref 0.0–40.0)

## 2020-08-18 LAB — HEPATIC FUNCTION PANEL
ALT: 22 U/L (ref 0–35)
AST: 20 U/L (ref 0–37)
Albumin: 4.3 g/dL (ref 3.5–5.2)
Alkaline Phosphatase: 96 U/L (ref 39–117)
Bilirubin, Direct: 0.1 mg/dL (ref 0.0–0.3)
Total Bilirubin: 0.4 mg/dL (ref 0.2–1.2)
Total Protein: 8 g/dL (ref 6.0–8.3)

## 2020-08-18 LAB — BASIC METABOLIC PANEL
BUN: 11 mg/dL (ref 6–23)
CO2: 30 mEq/L (ref 19–32)
Calcium: 9.4 mg/dL (ref 8.4–10.5)
Chloride: 100 mEq/L (ref 96–112)
Creatinine, Ser: 1.05 mg/dL (ref 0.40–1.20)
GFR: 56.52 mL/min — ABNORMAL LOW (ref >60.00)
Glucose, Bld: 89 mg/dL (ref 70–99)
Potassium: 4 mEq/L (ref 3.5–5.1)
Sodium: 137 mEq/L (ref 135–145)

## 2020-08-18 LAB — HEMOGLOBIN A1C: Hgb A1c MFr Bld: 6.3 % (ref 4.6–6.5)

## 2020-08-23 NOTE — Progress Notes (Signed)
Blood sugar stable cholesterol could be better we will review at upcoming visit.

## 2020-08-30 ENCOUNTER — Ambulatory Visit: Payer: 59 | Admitting: Internal Medicine

## 2020-09-19 ENCOUNTER — Other Ambulatory Visit: Payer: Self-pay

## 2020-09-19 ENCOUNTER — Ambulatory Visit: Payer: Commercial Managed Care - PPO | Admitting: Internal Medicine

## 2020-09-19 ENCOUNTER — Encounter: Payer: Self-pay | Admitting: Internal Medicine

## 2020-09-19 VITALS — BP 138/70 | HR 83 | Temp 98.5°F | Ht 64.0 in

## 2020-09-19 DIAGNOSIS — Z79899 Other long term (current) drug therapy: Secondary | ICD-10-CM

## 2020-09-19 DIAGNOSIS — E785 Hyperlipidemia, unspecified: Secondary | ICD-10-CM | POA: Diagnosis not present

## 2020-09-19 DIAGNOSIS — I10 Essential (primary) hypertension: Secondary | ICD-10-CM

## 2020-09-19 DIAGNOSIS — Z23 Encounter for immunization: Secondary | ICD-10-CM

## 2020-09-19 DIAGNOSIS — Z8673 Personal history of transient ischemic attack (TIA), and cerebral infarction without residual deficits: Secondary | ICD-10-CM

## 2020-09-19 NOTE — Patient Instructions (Addendum)
Repeat bp was 138/70    Take Crestor every day and   get an updated lipid panel in 2-3 months .  Continue lifestyle intervention healthy eating and exercise . As discussed .  Goal is ldl  around 70  sometimes have to add extra medication but life style very important also for prevention of future events.  Continue good blood pressure control. At home  monitor . Have pharmacy contact us  electronically   for refill meds .  Fu visit  depending on results or 6 mos .

## 2020-09-19 NOTE — Progress Notes (Signed)
Chief Complaint  Patient presents with   Follow-up    HPI: Morgan Maldonado 65 y.o. come in for Chronic disease management follow-up blood pressure control and hyperlipidemia after stroke event hospitalization in the spring.  Since that time she has worked hard on her diet and trying to avoid fried foods cooking differently although the weight has not been coming off. States she is taking the blood pressure medication without reported side effect and her readings are in the 01/28/1928 range at home.  No new numbness events chest pain shortness of breath HLD she was taking the Crestor but then stopped thinking she could get her level of lipids down with lifestyle.  However began taking the Crestor again 10 mg after the August labs and so far no side effects reported.  No new medical problems. He is doing some self anxiety counseling strategies.  Looking into other healthy weight loss ideas. Was anxietou about visit today so didn't sleep that well but otherwise doing ok  ROS: See pertinent positives and negatives per HPI. No cpbp sob neuro sx palitations can walk 2-3 miles and feels good with this  Past Medical History:  Diagnosis Date   Chicken pox    as a child   Hypertension     Family History  Problem Relation Age of Onset   Cancer Mother    Hypertension Mother    Diabetes Mother    Diabetes Father    Gout Sister    Healthy Brother     Social History   Socioeconomic History   Marital status: Married    Spouse name: Not on file   Number of children: 1   Years of education: 15   Highest education level: Not on file  Occupational History   Occupation: Copywriter, advertising: [employer]  Tobacco Use   Smoking status: Former    Years: 9.00    Types: Cigarettes   Smokeless tobacco: Never  Substance and Sexual Activity   Alcohol use: No   Drug use: No   Sexual activity: Not on file  Other Topics Concern   Not on file  Social History Narrative   Married self employed real  estate   About  50 minutes.   College degree   hh of 2   No pets             Social Determinants of Corporate investment banker Strain: Not on file  Food Insecurity: Not on file  Transportation Needs: Not on file  Physical Activity: Not on file  Stress: Not on file  Social Connections: Not on file    Outpatient Medications Prior to Visit  Medication Sig Dispense Refill   aspirin EC 81 MG EC tablet Take 1 tablet (81 mg total) by mouth daily. Swallow whole. 30 tablet 11   rosuvastatin (CRESTOR) 10 MG tablet Take 1 tablet (10 mg total) by mouth daily. 90 tablet 1   triamterene-hydrochlorothiazide (MAXZIDE-25) 37.5-25 MG tablet Take 1 tablet by mouth daily. 90 tablet 1   No facility-administered medications prior to visit.     EXAM:  BP 138/70   Pulse 83   Temp 98.5 F (36.9 C) (Oral)   Ht 5\' 4"  (1.626 m)   SpO2 98%   BMI 33.23 kg/m   Body mass index is 33.23 kg/m.  GENERAL: vitals reviewed and listed above, alert, oriented, appears well hydrated and in no acute distress HEENT: atraumatic, conjunctiva  clear, no obvious abnormalities on inspection of external  nose and ears OP masked  NECK: no obvious masses on inspection palpation  LUNGS: clear to auscultation bilaterally, no wheezes, rales or rhonchi, good air movement CV: HRRR, no clubbing cyanosis or  peripheral edema nl cap refill  Abdomen:  Sof,t normal bowel sounds without hepatosplenomegaly, no guarding rebound or masses no CVA tenderness MS: moves all extremities without noticeable focal  abnormality PSYCH: pleasant and cooperative, no obvious depression or anxiety Lab Results  Component Value Date   WBC 7.2 04/15/2020   HGB 12.8 04/15/2020   HCT 40.0 04/15/2020   PLT 288 04/15/2020   GLUCOSE 89 08/18/2020   CHOL 231 (H) 08/18/2020   TRIG 166.0 (H) 08/18/2020   HDL 44.00 08/18/2020   LDLDIRECT 122.7 06/14/2011   LDLCALC 154 (H) 08/18/2020   ALT 22 08/18/2020   AST 20 08/18/2020   NA 137 08/18/2020    K 4.0 08/18/2020   CL 100 08/18/2020   CREATININE 1.05 08/18/2020   BUN 11 08/18/2020   CO2 30 08/18/2020   TSH 3.389 04/16/2020   HGBA1C 6.3 08/18/2020   BP Readings from Last 3 Encounters:  09/19/20 138/70  04/19/20 140/68  04/16/20 (!) 163/84    ASSESSMENT AND PLAN:  Discussed the following assessment and plan:  Hyperlipidemia, unspecified hyperlipidemia type - Plan: Lipid panel  Medication management - Plan: Lipid panel  Need for influenza vaccination - Plan: Flu Vaccine QUAD 6+ mos PF IM (Fluarix Quad PF)  Primary hypertension  History of transient ischemic attack (TIA) - r pca stenosis Agree with hea;thy weight loss   bp control  will go back on Crestor daily and see leipi panel  may not get to goal but reviewed  rationale and other interventions.  Dsic flu vaccine    today .  -Patient advised to return or notify health care team  if  new concerns arise.  Patient Instructions  Repeat bp was 138/70    Take Crestor every day and   get an updated lipid panel in 2-3 months .  Continue lifestyle intervention healthy eating and exercise . As discussed .  Goal is ldl  around 70  sometimes have to add extra medication but life style very important also for prevention of future events.  Continue good blood pressure control. At home  monitor . Have pharmacy contact us  electronically   for refill meds .  Fu visit  depending on results or 6 mos .   Morgan Maldonado. Morgan Maldonado M.D.

## 2020-10-11 ENCOUNTER — Other Ambulatory Visit: Payer: Self-pay | Admitting: Internal Medicine

## 2020-12-19 ENCOUNTER — Other Ambulatory Visit: Payer: Commercial Managed Care - PPO

## 2021-04-20 ENCOUNTER — Other Ambulatory Visit: Payer: Self-pay | Admitting: Internal Medicine

## 2021-11-30 IMAGING — MG MM DIGITAL SCREENING BILAT W/ TOMO AND CAD
8 series · 8 of 24 positions shown · non-contrast
Comparison: Previous exam(s).

CLINICAL DATA: Screening.

EXAM:
DIGITAL SCREENING BILATERAL MAMMOGRAM WITH TOMOSYNTHESIS AND CAD
TECHNIQUE: Bilateral screening digital craniocaudal and mediolateral oblique
mammograms were obtained. Bilateral screening digital breast
tomosynthesis was performed. The images were evaluated with
computer-aided detection.

[L MLO synth-2D]
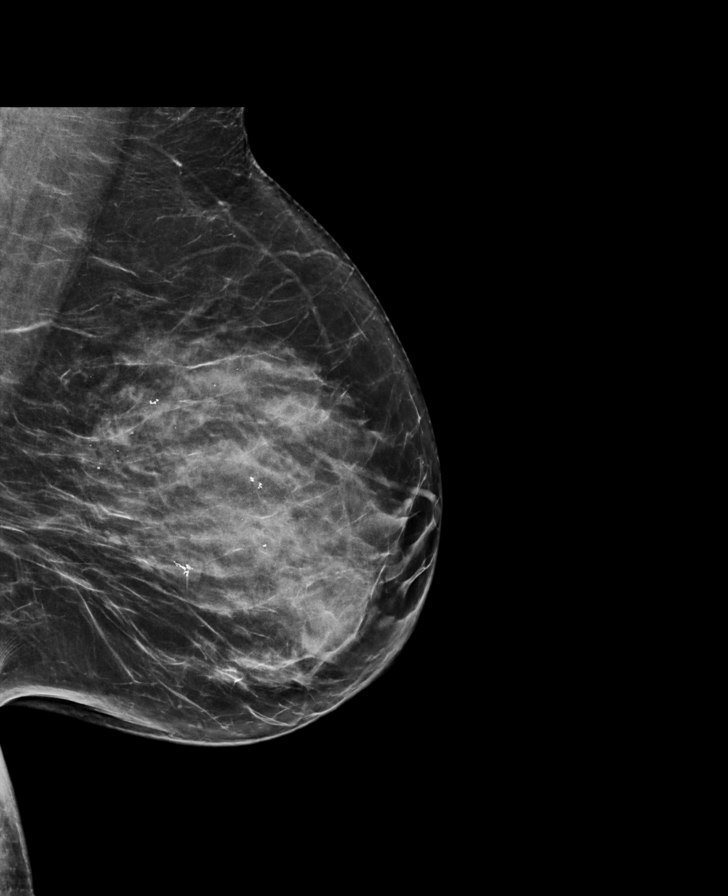

[R CC synth-2D]
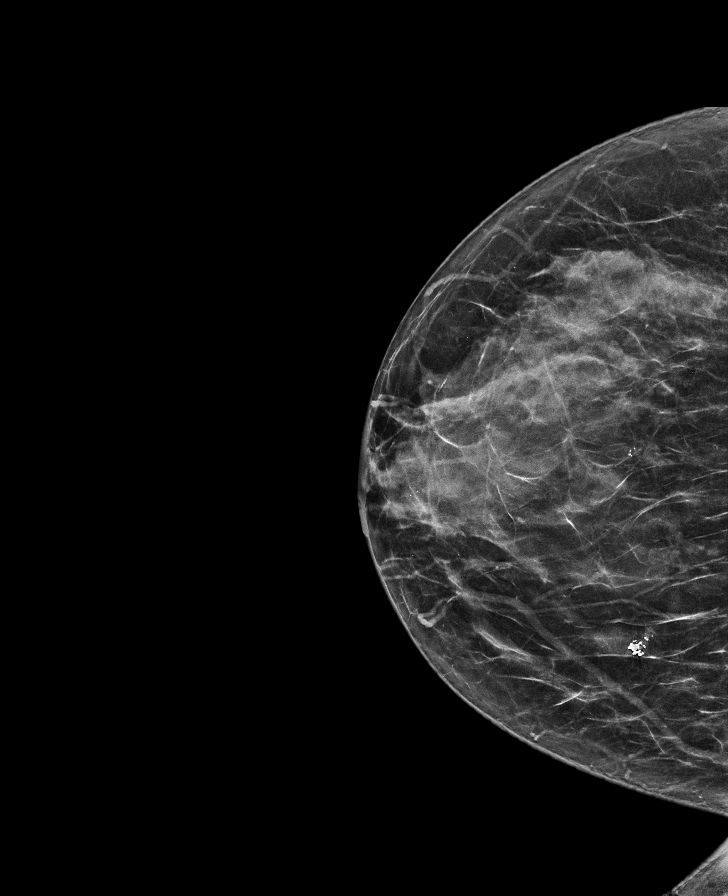

[R MLO synth-2D]
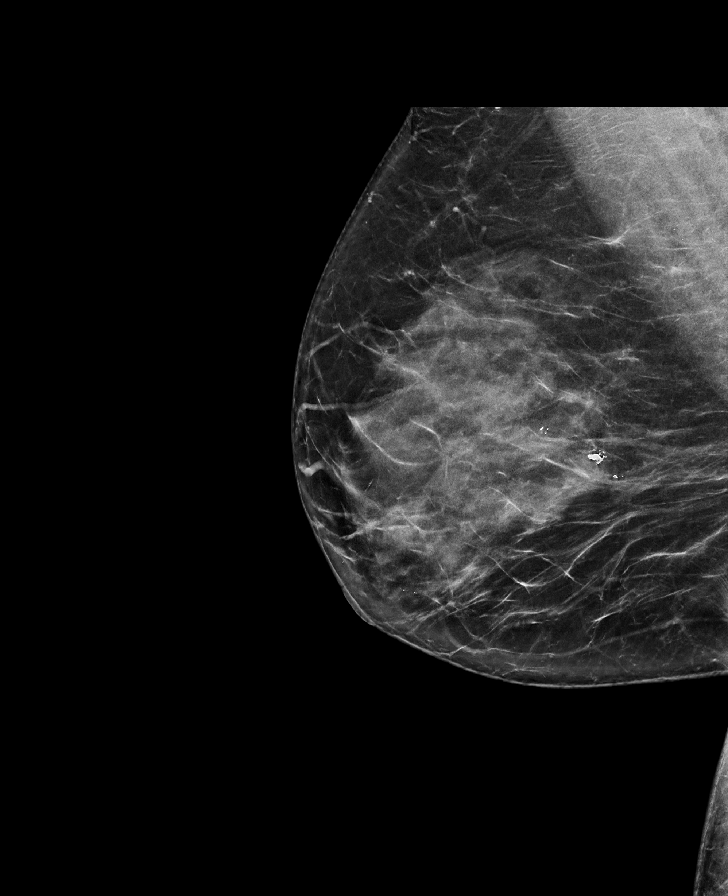

[L CC synth-2D]
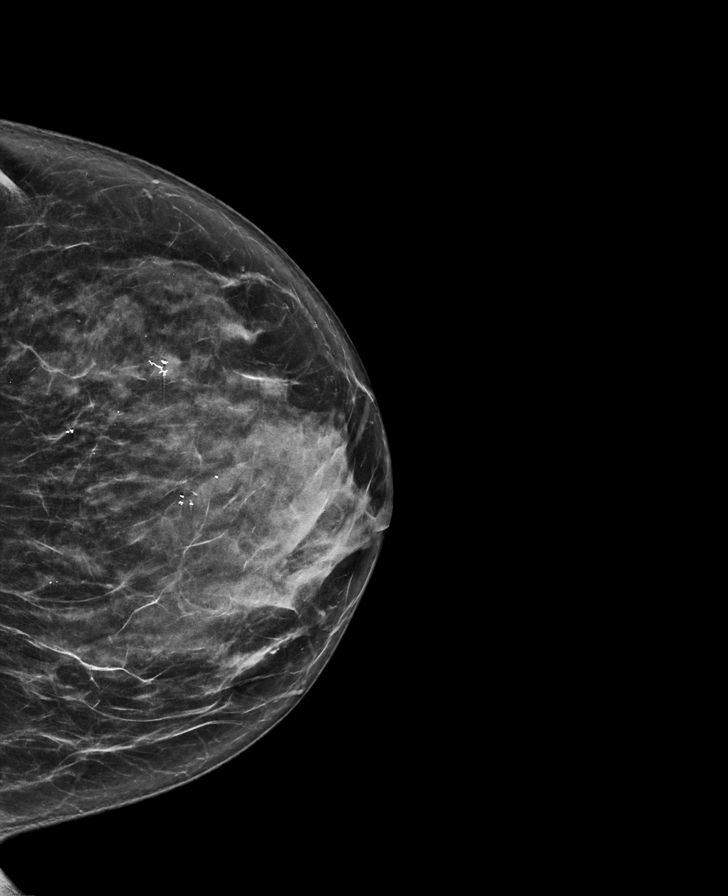

[L MLO tomo · tomo slice 39/77.0]
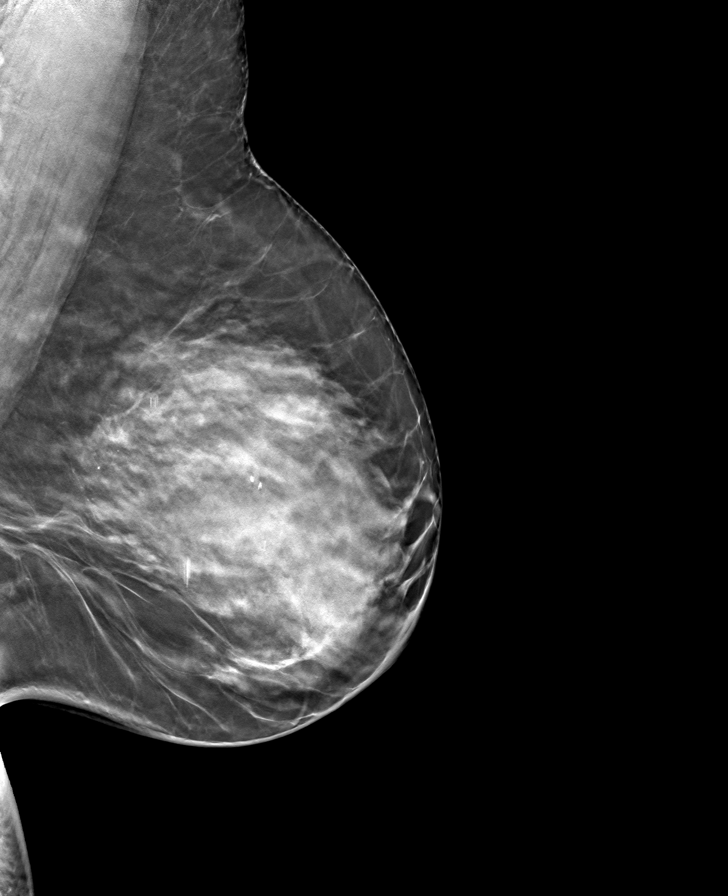

[L CC tomo · tomo slice 35/68.0]
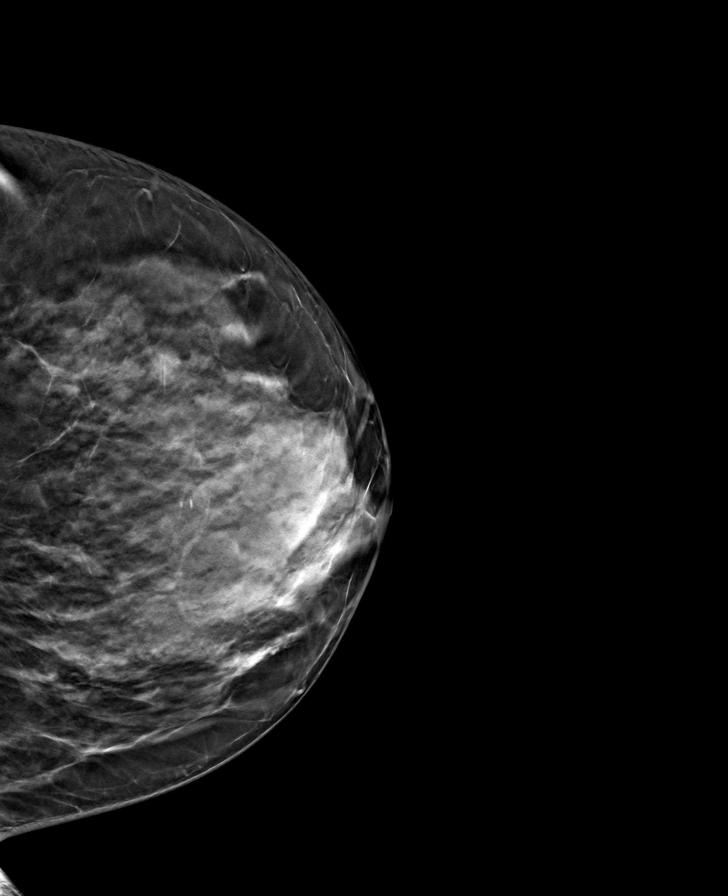

[R MLO tomo · tomo slice 39/78.0]
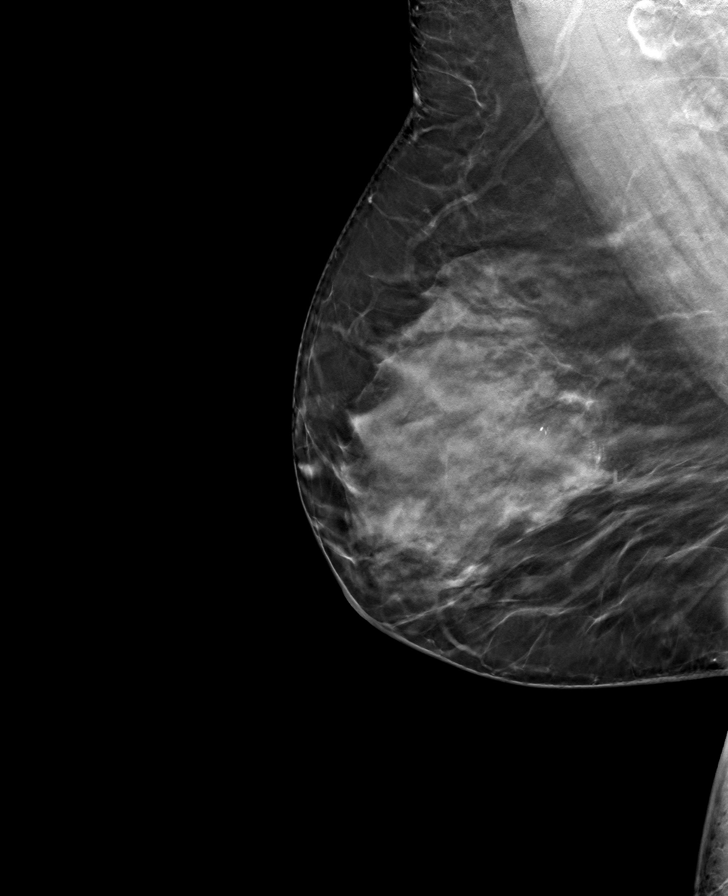

[R CC tomo · tomo slice 33/64.0]
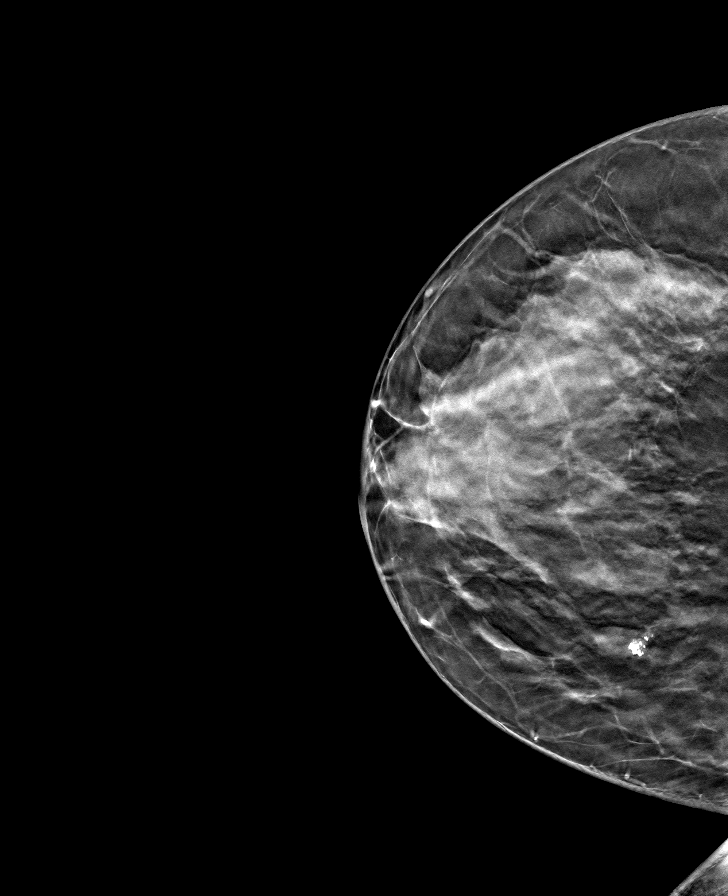

[8 of 24 positions shown; findings below may reference images not displayed]

ACR Breast Density Category c: The breast tissue is heterogeneously
dense, which may obscure small masses.
FINDINGS: There are no findings suspicious for malignancy. The images were
evaluated with computer-aided detection.
IMPRESSION: No mammographic evidence of malignancy. A result letter of this
screening mammogram will be mailed directly to the patient.

RECOMMENDATION:
Screening mammogram in one year. (Code:T4-5-GWO)

BI-RADS CATEGORY  1: Negative.

## 2023-01-10 ENCOUNTER — Telehealth: Payer: Self-pay | Admitting: Internal Medicine

## 2023-01-10 NOTE — Telephone Encounter (Signed)
 Called pt to sch annual physical, left vm. Pls sch pt for physical appt.

## 2023-07-12 ENCOUNTER — Encounter (HOSPITAL_COMMUNITY): Payer: Self-pay

## 2023-07-12 ENCOUNTER — Ambulatory Visit (HOSPITAL_COMMUNITY)
Admission: EM | Admit: 2023-07-12 | Discharge: 2023-07-12 | Disposition: A | Discharge status: Home / Self Care | Attending: Nurse Practitioner | Admitting: Nurse Practitioner

## 2023-07-12 DIAGNOSIS — I160 Hypertensive urgency: Secondary | ICD-10-CM

## 2023-07-12 DIAGNOSIS — T63461A Toxic effect of venom of wasps, accidental (unintentional), initial encounter: Secondary | ICD-10-CM | POA: Diagnosis not present

## 2023-07-12 DIAGNOSIS — I10 Essential (primary) hypertension: Secondary | ICD-10-CM | POA: Diagnosis not present

## 2023-07-12 DIAGNOSIS — Z91148 Patient's other noncompliance with medication regimen for other reason: Secondary | ICD-10-CM | POA: Diagnosis not present

## 2023-07-12 MED ORDER — CLONIDINE HCL 0.1 MG PO TABS
ORAL_TABLET | ORAL | Status: AC
  Filled 2023-07-12: qty 2

## 2023-07-12 MED ORDER — PREDNISONE 20 MG PO TABS: 40.0000 mg | ORAL_TABLET | Freq: Every day | ORAL | 0 refills | Status: AC

## 2023-07-12 MED ORDER — CLONIDINE HCL 0.1 MG PO TABS
0.2000 mg | ORAL_TABLET | Freq: Once | ORAL | Status: AC
  Administered 2023-07-12: 0.2 mg via ORAL

## 2023-07-12 NOTE — ED Triage Notes (Signed)
 Patient here today with c/o left hand pain from being stung by a wasp yesterday. Patient now has swelling, redness, and itching.

## 2023-07-12 NOTE — Discharge Instructions (Addendum)
 You were seen today for swelling and intense itching in your left ring finger, likely due to a localized reaction from an insect bite or irritation. There were no signs of infection. You were prescribed a short course of prednisone  to reduce inflammation. You may also soak your hand in warm water with Epsom salt to ease discomfort and take oral Benadryl if itching continues.  Your blood pressure was also found to be very high during today's visit. You shared that you have not been taking your prescribed blood pressure medication regularly. You took your Maxide here in the clinic, and your blood pressure was still significantly elevated on recheck. We gave you a medication called Clonidine  which brought your blood pressure down some. Although it is still elevated you are no longer in the dangerous range. It's very important that you take your blood pressure medicine every day as prescribed, even if you feel fine. Skipping medication can increase your risk of serious health problems such as stroke, heart attack, or kidney damage. Monitor your blood pressure closely. You should check it at least once a day and record the readings. Take a record of your blood pressure readings to your primary care provider so that your medications can be adjusted if needed.   Contact your primary care provider to follow up on both your blood pressure and finger symptoms.   Go to the emergency room if you experience chest pain, severe headache, vision changes, confusion, difficulty breathing, facial drooping, weakness, or if the swelling in your hand worsens, becomes red or hot, or you develop a fever.

## 2023-07-12 NOTE — ED Provider Notes (Signed)
 [facility]    CSN: [id number] Arrival date & time: 07/12/23  1129      History   Chief Complaint Chief Complaint  Patient presents with   Insect Bite    Wasp    HPI Morgan Maldonado is a 68 y.o. female.   Discussed the use of AI scribe software for clinical note transcription with the patient, who gave verbal consent to proceed.   Patient presents with a wasp bite and uncontrolled hypertension. The wasp sting occurred yesterday, resulting in an inability to bend the affected finger, intense itching, and a localized inflammatory response with swelling and redness. The patient applied alcohol, antibacterial solution, and peroxide to the site. She attempted to squeeze the area, unsure if the stinger was still present. The patient reports intense itching to the finger.   Additionally, the patient has a history of hypertension and is prescribed Maxzide , which she admits to taking inconsistently. She hasn't taken her medication in the past several days. The patient expresses concern about her blood pressure, mentioning she had hoped it would decrease on its own. She denies any visual disturbances, chest pain, shortness of breath, palpitations, headaches or weakness.  The following portions of the patient's history were reviewed and updated as appropriate: allergies, current medications, past family history, past medical history, past social history, past surgical history, and problem list.      Past Medical History:  Diagnosis Date   Chicken pox    as a child   Hypertension     Patient Active Problem List   Diagnosis Date Noted   TIA (transient ischemic attack) 04/16/2020   Hypertensive urgency 04/16/2020   Sinus bradycardia 04/16/2020   Cough due to ACE inhibitor 10/18/2011   Encounter for preventive health examination 06/14/2011   Hypertension 06/14/2011   OBESITY 09/26/2008   ELEVATED BLOOD PRESSURE WITHOUT DIAGNOSIS OF HYPERTENSION 09/26/2008    Past Surgical  History:  Procedure Laterality Date   bx right breast benign fibroadenoma     miscarriages     1998-1999 tubal pg   right tube and ovary     remove tubal pregnancy 1999    OB History     Gravida  1   Para  1   Term      Preterm      AB      Living         SAB      IAB      Ectopic      Multiple      Live Births               Home Medications    Prior to Admission medications   Medication Sig Start Date End Date Taking? Authorizing Provider  predniSONE  (DELTASONE ) 20 MG tablet Take 2 tablets (40 mg total) by mouth daily for 5 days. 07/12/23 07/17/23 Yes Iola Lukes, FNP  aspirin  EC 81 MG EC tablet Take 1 tablet (81 mg total) by mouth daily. Swallow whole. 04/17/20   Caleen Qualia, MD  triamterene -hydrochlorothiazide  (MAXZIDE -25) 37.5-25 MG tablet TAKE 1 TABLET BY MOUTH EVERY DAY 04/24/21   Panosh, Apolinar POUR, MD    Family History Family History  Problem Relation Age of Onset   Cancer Mother    Hypertension Mother    Diabetes Mother    Diabetes Father    Gout Sister    Healthy Brother     Social History Social History   Tobacco Use   Smoking status: Former  Types: Cigarettes   Smokeless tobacco: Never  Substance Use Topics   Alcohol use: No   Drug use: No     Allergies   Codeine, Hydrocodone, Penicillins, Lisinopril , and Lotrel [amlodipine  besy-benazepril  hcl]   Review of Systems Review of Systems  Eyes:  Negative for visual disturbance.  Respiratory:  Negative for shortness of breath.   Cardiovascular:  Negative for chest pain, palpitations and leg swelling.  Skin:  Positive for wound.  Neurological:  Negative for dizziness, weakness, numbness and headaches.  All other systems reviewed and are negative.   Physical Exam Triage Vital Signs ED Triage Vitals  Encounter Vitals Group     BP 07/12/23 1149 (!) 198/91     Girls Systolic BP Percentile --      Girls Diastolic BP Percentile --      Boys Systolic BP Percentile --       Boys Diastolic BP Percentile --      Pulse Rate 07/12/23 1149 74     Resp 07/12/23 1149 16     Temp 07/12/23 1149 98.1 F (36.7 C)     Temp Source 07/12/23 1149 Oral     SpO2 07/12/23 1149 97 %     Weight --      Height --      Head Circumference --      Peak Flow --      Pain Score 07/12/23 1153 5     Pain Loc --      Pain Education --      Exclude from Growth Chart --    No data found.  Updated Vital Signs BP (!) 172/80 (BP Location: Left Arm)   Pulse 74   Temp 98.1 F (36.7 C) (Oral)   Resp 16   SpO2 97%   Visual Acuity Right Eye Distance:   Left Eye Distance:   Bilateral Distance:    Right Eye Near:   Left Eye Near:    Bilateral Near:     Physical Exam Vitals reviewed.  Constitutional:      General: She is awake. She is not in acute distress.    Appearance: Normal appearance. She is well-developed. She is not ill-appearing, toxic-appearing or diaphoretic.  HENT:     Head: Normocephalic.     Right Ear: Hearing normal.     Left Ear: Hearing normal.     Nose: Nose normal.     Mouth/Throat:     Mouth: Mucous membranes are moist.  Eyes:     General: Vision grossly intact.     Conjunctiva/sclera: Conjunctivae normal.  Cardiovascular:     Rate and Rhythm: Normal rate and regular rhythm.     Heart sounds: Normal heart sounds.  Pulmonary:     Effort: Pulmonary effort is normal.     Breath sounds: Normal breath sounds and air entry.  Musculoskeletal:        General: Normal range of motion.     Cervical back: Full passive range of motion without pain, normal range of motion and neck supple.     Right lower leg: No edema.     Left lower leg: No edema.     Comments: Pin point lesion noted to the lateral aspect of left 4th finger with localized swelling and mild warmth. No drainage, decreased sensation, decreased strength or deformity noted within the finger or hand (see images below)   Skin:    General: Skin is warm and dry.  Neurological:     General: No  focal  deficit present.     Mental Status: She is alert and oriented to person, place, and time.     Sensory: Sensation is intact. No sensory deficit.     Motor: Motor function is intact. No weakness.     Coordination: Coordination is intact.     Gait: Gait is intact.  Psychiatric:        Speech: Speech normal.        Behavior: Behavior is cooperative.         UC Treatments / Results  Labs (all labs ordered are listed, but only abnormal results are displayed) Labs Reviewed - No data to display  EKG   Radiology No results found.  Procedures Procedures (including critical care time)  Medications Ordered in UC Medications  cloNIDine  (CATAPRES ) tablet 0.2 mg (0.2 mg Oral Given 07/12/23 1333)    Initial Impression / Assessment and Plan / UC Course  I have reviewed the triage vital signs and the nursing notes.  Pertinent labs & imaging results that were available during my care of the patient were reviewed by me and considered in my medical decision making (see chart for details).     68 year old female with a history of hypertension presents with localized swelling and intense itching to the left ring finger following a wasp sting. Examination reveals a localized inflammatory reaction without signs of infection. A short course of prednisone  was prescribed, and warm Epsom salt soaks along with oral Benadryl were recommended for symptom relief. During the visit, her blood pressure was found to be severely elevated at 198/91. She admitted to not taking her prescribed antihypertensive medications consistently and had not taken them today or in the past few days, though she had them in her possession. She is currently asymptomatic. The importance of medication adherence and the risks associated with uncontrolled hypertension were discussed. She took her Maxide in clinic and her blood pressure was rechecked and still noted to be elevated. She was given clonidine  0.2 mg with improvement to  172/80 at discharge. Patient advised to monitor her BP daily and as needed. ED precautions were thoroughly reviewed. Follow-up with her primary care provider is advised for continued blood pressure management and reassessment of her finger if symptoms persist or worsen.  Today's evaluation has revealed no signs of a dangerous process. Discussed diagnosis with patient and/or guardian. Patient and/or guardian aware of their diagnosis, possible red flag symptoms to watch out for and need for close follow up. Patient and/or guardian understands verbal and written discharge instructions. Patient and/or guardian comfortable with plan and disposition.  Patient and/or guardian has a clear mental status at this time, good insight into illness (after discussion and teaching) and has clear judgment to make decisions regarding their care  Documentation was completed with the aid of voice recognition software. Transcription may contain typographical errors. Final Clinical Impressions(s) / UC Diagnoses   Final diagnoses:  Wasp sting, accidental or unintentional, initial encounter  Asymptomatic hypertensive urgency  Noncompliance w/medication treatment due to intermit use of medication  Essential hypertension     Discharge Instructions      You were seen today for swelling and intense itching in your left ring finger, likely due to a localized reaction from an insect bite or irritation. There were no signs of infection. You were prescribed a short course of prednisone  to reduce inflammation. You may also soak your hand in warm water with Epsom salt to ease discomfort and take oral Benadryl if itching continues.  Your blood pressure was also found to be very high during today's visit. You shared that you have not been taking your prescribed blood pressure medication regularly. You took your Maxide here in the clinic, and your blood pressure was rechecked before you left. It's very important that you take your  blood pressure medicine every day as prescribed, even if you feel fine. Skipping medication can increase your risk of serious health problems such as stroke, heart attack, or kidney damage.  Contact your primary care provider to follow up on both your blood pressure and finger symptoms.   Go to the emergency room if you experience chest pain, severe headache, vision changes, confusion, difficulty breathing, facial drooping, weakness, or if the swelling in your hand worsens, becomes red or hot, or you develop a fever.      ED Prescriptions     Medication Sig Dispense Auth. Provider   predniSONE  (DELTASONE ) 20 MG tablet Take 2 tablets (40 mg total) by mouth daily for 5 days. 10 tablet Iola Lukes, FNP      PDMP not reviewed this encounter.   Iola Lukes, [place] 07/12/23 [phone]

## 2023-08-14 ENCOUNTER — Encounter: Payer: Self-pay | Admitting: Internal Medicine

## 2023-08-14 ENCOUNTER — Other Ambulatory Visit: Payer: Self-pay | Admitting: Internal Medicine

## 2023-08-14 ENCOUNTER — Ambulatory Visit (INDEPENDENT_AMBULATORY_CARE_PROVIDER_SITE_OTHER): Admitting: Internal Medicine

## 2023-08-14 VITALS — BP 160/70 | HR 64 | Temp 98.1°F | Ht 64.0 in | Wt 201.4 lb

## 2023-08-14 DIAGNOSIS — I10 Essential (primary) hypertension: Secondary | ICD-10-CM | POA: Diagnosis not present

## 2023-08-14 DIAGNOSIS — Z79899 Other long term (current) drug therapy: Secondary | ICD-10-CM

## 2023-08-14 DIAGNOSIS — E785 Hyperlipidemia, unspecified: Secondary | ICD-10-CM

## 2023-08-14 MED ORDER — TRIAMTERENE-HCTZ 37.5-25 MG PO TABS: 1.0000 | ORAL_TABLET | Freq: Every day | ORAL | 3 refills | Status: AC

## 2023-08-14 NOTE — Progress Notes (Signed)
 Chief Complaint  Patient presents with   Hospitalization Follow-up    Pt reports she was at urgent and BP was high. Had to stay for couple of hours.     HPI: Morgan Maldonado 68 y.o. come in for  fu ed/UC  after presenting with large rx to wasp sting and was noted to have very elevted bp 170 range  fiven clonidine  and tod to fu Last check here was over 2 yeasrs ago   whe was taking maxide and not off for quite a while [place]  Bp was doing ok  when was  monitoring   140/80 range and ocass 128.  Off  med at least a year   150/70 last week.  Family hx neg stroke . Mi   ? Ht. 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  ROS: See pertinent positives and negatives per HPI. No cp sob other health sx   Past Medical History:  Diagnosis Date   Chicken pox    as a child   Hypertension     Family History  Problem Relation Age of Onset   Cancer Mother    Hypertension Mother    Diabetes Mother    Diabetes Father    Gout Sister    Healthy Brother     Social History   Socioeconomic History   Marital status: Married    Spouse name: Not on file   Number of children: 1   Years of education: 15   Highest education level: Not on file  Occupational History   Occupation: Copywriter, advertising: [employer]  Tobacco Use   Smoking status: Former    Types: Cigarettes   Smokeless tobacco: Never  Substance and Sexual Activity   Alcohol use: No   Drug use: No   Sexual activity: Not on file  Other Topics Concern   Not on file  Social History Narrative   Married self employed real estate   About  50 minutes.   College degree   hh of 2   No pets             Social Drivers of Corporate investment banker Strain: Not on file  Food Insecurity: Not on file  Transportation Needs: Not on file  Physical Activity: Not on file  Stress: Not on file  Social Connections: Not on file    Outpatient  Medications Prior to Visit  Medication Sig Dispense Refill   aspirin  EC 81 MG EC tablet Take 1 tablet (81 mg total) by mouth daily. Swallow whole. 30 tablet 11   VITAMIN D PO Take by mouth once a week.     triamterene -hydrochlorothiazide  (MAXZIDE -25) 37.5-25 MG tablet TAKE 1 TABLET BY MOUTH EVERY DAY (Patient not taking: Reported on 08/14/2023) 90 tablet 1   No facility-administered medications prior to visit.     EXAM:  BP (!) 160/70 (BP Location: Right Arm, Patient Position: Sitting, Cuff Size: Large)   Pulse 64   Temp 98.1 F (36.7 C) (Oral)   Ht 5' 4 (1.626 m)   Wt 201 lb 6.4 oz (91.4 kg)   SpO2 98%   BMI 34.57 kg/m   Body mass index is 34.57 kg/m.  GENERAL: vitals reviewed and listed above, alert, oriented, appears well hydrated and in no acute distress HEENT: atraumatic, conjunctiva  clear, no obvious abnormalities on inspection  of external nose and ears  NECK: no obvious masses on inspection palpation  LUNGS: clear to auscultation bilaterally, no wheezes, rales or rhonchi, good air movement CV: HRRR, no clubbing cyanosis or  peripheral edema nl cap refill  Abdomen:  Sof,t normal bowel sounds without hepatosplenomegaly, no guarding rebound or masses no CVA tenderness MS: moves all extremities without noticeable focal  abnormality PSYCH: pleasant and cooperative, no obvious depression or anxiety Lab Results  Component Value Date   WBC 7.2 04/15/2020   HGB 12.8 04/15/2020   HCT 40.0 04/15/2020   PLT 288 04/15/2020   GLUCOSE 89 08/18/2020   CHOL 231 (H) 08/18/2020   TRIG 166.0 (H) 08/18/2020   HDL 44.00 08/18/2020   LDLDIRECT 122.7 06/14/2011   LDLCALC 154 (H) 08/18/2020   ALT 22 08/18/2020   AST 20 08/18/2020   NA 137 08/18/2020   K 4.0 08/18/2020   CL 100 08/18/2020   CREATININE 1.05 08/18/2020   BUN 11 08/18/2020   CO2 30 08/18/2020   TSH 3.389 04/16/2020   HGBA1C 6.3 08/18/2020   BP Readings from Last 3 Encounters:  08/14/23 (!) 160/70  07/12/23 (!)  172/80  09/19/20 138/70    ASSESSMENT AND PLAN:  Discussed the following assessment and plan:  Primary hypertension  Medication management Untreated off meds for 1+ yeats    Intensify lifestyle interventions. As she is aware  Restart medication   check lab in 2-3 weeks on meds  Plan fu appt with monitor and readings in 2-3 months  or if remaining 160 and above earlier  -Patient advised to return or notify health care team  if  new concerns arise. I personally spent a total of 34 minutes in the care of the patient today including getting/reviewing separately obtained history, performing a medically appropriate exam/evaluation, counseling and educating, placing orders, and documenting clinical information in the EHR.  Patient Instructions  Restart  BP medication.  Plan   fasting lab appt 2-3 weeks   and then fu in 4-6 weeks OV and bring your monitor    Take blood pressure readings twice a day for 3-5 days and then periodically .To ensure below 140/90   .  Average  goal is 130/80 and below    acceptable is average below 140/90 .  Please bring your blood pressure cuff to next appointment Take blood pressure readings twice a day for 7- 10 days and record .     Take 2 -3 readings at each sitting .   Can send in readings  by My Chart.     Before checking your blood pressure make sure: You are seated and quite for 5 min before checking Feet are flat on the floor Siting in chair with your back supported straight up and down Arm resting on table or arm of chair at heart level Bladder is empty You have NOT had caffeine or tobacco within the last 30 min  [email or web address]         Doris Gruhn K. Tajuanna Burnett M.D.

## 2023-08-14 NOTE — Patient Instructions (Addendum)
 Restart  BP medication.  Plan   fasting lab appt 2-3 weeks   and then fu in 4-6 weeks OV and bring your monitor    Take blood pressure readings twice a day for 3-5 days and then periodically .To ensure below 140/90   .  Average  goal is 130/80 and below    acceptable is average below 140/90 .  Please bring your blood pressure cuff to next appointment Take blood pressure readings twice a day for 7- 10 days and record .     Take 2 -3 readings at each sitting .   Can send in readings  by My Chart.     Before checking your blood pressure make sure: You are seated and quite for 5 min before checking Feet are flat on the floor Siting in chair with your back supported straight up and down Arm resting on table or arm of chair at heart level Bladder is empty You have NOT had caffeine or tobacco within the last 30 min  [email or web address]

## 2023-09-24 ENCOUNTER — Other Ambulatory Visit

## 2023-10-01 ENCOUNTER — Other Ambulatory Visit (INDEPENDENT_AMBULATORY_CARE_PROVIDER_SITE_OTHER)

## 2023-10-01 DIAGNOSIS — Z79899 Other long term (current) drug therapy: Secondary | ICD-10-CM

## 2023-10-01 DIAGNOSIS — E785 Hyperlipidemia, unspecified: Secondary | ICD-10-CM

## 2023-10-01 DIAGNOSIS — I10 Essential (primary) hypertension: Secondary | ICD-10-CM | POA: Diagnosis not present

## 2023-10-01 LAB — BASIC METABOLIC PANEL WITH GFR
BUN: 12 mg/dL (ref 6–23)
CO2: 29 meq/L (ref 19–32)
Calcium: 9.1 mg/dL (ref 8.4–10.5)
Chloride: 103 meq/L (ref 96–112)
Creatinine, Ser: 0.91 mg/dL (ref 0.40–1.20)
GFR: 65.66 mL/min (ref >60.00)
Glucose, Bld: 91 mg/dL (ref 70–99)
Potassium: 3.8 meq/L (ref 3.5–5.1)
Sodium: 139 meq/L (ref 135–145)

## 2023-10-01 LAB — HEPATIC FUNCTION PANEL
ALT: 23 U/L (ref 0–35)
AST: 21 U/L (ref 0–37)
Albumin: 4.4 g/dL (ref 3.5–5.2)
Alkaline Phosphatase: 77 U/L (ref 39–117)
Bilirubin, Direct: 0.1 mg/dL (ref 0.0–0.3)
Total Bilirubin: 0.3 mg/dL (ref 0.2–1.2)
Total Protein: 8.2 g/dL (ref 6.0–8.3)

## 2023-10-01 LAB — CBC WITH DIFFERENTIAL/PLATELET
Basophils Absolute: 0 K/uL (ref 0.0–0.1)
Basophils Relative: 0.6 % (ref 0.0–3.0)
Eosinophils Absolute: 0.1 K/uL (ref 0.0–0.7)
Eosinophils Relative: 1.3 % (ref 0.0–5.0)
HCT: 40.2 % (ref 36.0–46.0)
Hemoglobin: 13.1 g/dL (ref 12.0–15.0)
Lymphocytes Relative: 64.6 % — ABNORMAL HIGH (ref 12.0–46.0)
Lymphs Abs: 2.7 K/uL (ref 0.7–4.0)
MCHC: 32.6 g/dL (ref 30.0–36.0)
MCV: 79.5 fl (ref 78.0–100.0)
Monocytes Absolute: 0.4 K/uL (ref 0.1–1.0)
Monocytes Relative: 9.6 % (ref 3.0–12.0)
Neutro Abs: 1 K/uL — ABNORMAL LOW (ref 1.4–7.7)
Neutrophils Relative %: 23.9 % — ABNORMAL LOW (ref 43.0–77.0)
Platelets: 247 K/uL (ref 150.0–400.0)
RBC: 5.05 Mil/uL (ref 3.87–5.11)
RDW: 13.6 % (ref 11.5–15.5)
WBC: 4.2 K/uL (ref 4.0–10.5)

## 2023-10-01 LAB — LIPID PANEL
Cholesterol: 249 mg/dL — ABNORMAL HIGH (ref 0–200)
HDL: 42.1 mg/dL (ref >39.00)
LDL Cholesterol: 166 mg/dL — ABNORMAL HIGH (ref 0–99)
NonHDL: 207.17
Total CHOL/HDL Ratio: 6
Triglycerides: 205 mg/dL — ABNORMAL HIGH (ref 0.0–149.0)
VLDL: 41 mg/dL — ABNORMAL HIGH (ref 0.0–40.0)

## 2023-10-01 LAB — HEMOGLOBIN A1C: Hgb A1c MFr Bld: 6.7 % — ABNORMAL HIGH (ref 4.6–6.5)

## 2023-10-01 LAB — TSH: TSH: 4.01 u[IU]/mL (ref 0.35–5.50)

## 2023-10-07 ENCOUNTER — Ambulatory Visit: Payer: Self-pay | Admitting: Internal Medicine

## 2023-10-07 DIAGNOSIS — Z79899 Other long term (current) drug therapy: Secondary | ICD-10-CM

## 2023-10-07 DIAGNOSIS — E785 Hyperlipidemia, unspecified: Secondary | ICD-10-CM

## 2023-10-07 NOTE — Progress Notes (Signed)
 Cholesterol not as favorable A1c is not in the early diabetic range  Kidney thyroid   liver r tests all normal I advise  fu visit regarding  BP control and  elevated A1c in next month

## 2023-10-08 ENCOUNTER — Other Ambulatory Visit

## 2023-12-09 ENCOUNTER — Ambulatory Visit: Admitting: Family Medicine

## 2023-12-09 DIAGNOSIS — Z0000 Encounter for general adult medical examination without abnormal findings: Secondary | ICD-10-CM | POA: Diagnosis not present

## 2023-12-09 NOTE — Patient Instructions (Addendum)
 I really enjoyed getting to talk with you today! I am available on Tuesdays and Thursdays for virtual visits if you have any questions or concerns, or if I can be of any further assistance.   CHECKLIST FROM ANNUAL WELLNESS VISIT:  -Follow up (please call to schedule if not scheduled after visit):   -yearly for annual wellness visit with primary care office  Here is a list of your preventive care/health maintenance measures and the plan for each if any are due:  PLAN For any measures below that may be due:    1. Please complete the cologuard test.   2. Get mammogram. Please send Dr. Charlett copy of report.    3. Can get vaccines at the pharmacy. Please let us  know if you if you do so that we can update your record.   Health Maintenance  Topic Date Due   Hepatitis C Screening  Never done   Zoster Vaccines- Shingrix (1 of 2) Never done   Fecal DNA (Cologuard)  Never done   Pneumococcal Vaccine: 50+ Years (1 of 1 - PCV) Never done   DTaP/Tdap/Td (2 - Tdap) 09/27/2018   COVID-19 Vaccine (3 - Moderna risk series) 06/12/2019   Bone Density Scan  Never done   Mammogram  06/01/2022   Influenza Vaccine  08/08/2023   Medicare Annual Wellness (AWV)  12/08/2024   Meningococcal B Vaccine  Aged Out   Colonoscopy  Discontinued    -See a dentist at least yearly  -Get your eyes checked and then per your eye specialist's recommendations  -Other issues addressed today:   -I have included below further information regarding a healthy whole foods based diet, physical activity guidelines for adults, stress management and opportunities for social connections. I hope you find this information useful.   -----------------------------------------------------------------------------------------------------------------------------------------------------------------------------------------------------------------------------------------------------------    NUTRITION: -eat real food: lots of  colorful vegetables (half the plate) and fruits -5-7 servings of vegetables and fruits per day (fresh or steamed is best), exp. 2 servings of vegetables with lunch and dinner and 2 servings of fruit per day. Berries and greens such as kale and collards are great choices.  -consume on a regular basis:  fresh fruits, fresh veggies, fish, nuts, seeds, healthy oils (such as olive oil, avocado oil), whole grains (make sure for bread/pasta/crackers/etc., that the first ingredient on label contains the word whole), legumes. -can eat small amounts of dairy and lean meat (no larger than the palm of your hand), but avoid processed meats such as ham, bacon, lunch meat, etc. -drink water -try to avoid fast food and pre-packaged foods, processed meat, ultra processed foods/beverages (donuts, candy, etc.) -most experts advise limiting sodium to < 2300mg  per day, should limit further is any chronic conditions such as high blood pressure, heart disease, diabetes, etc. The American Heart Association advised that < 1500mg  is is ideal -try to avoid foods/beverages that contain any ingredients with names you do not recognize  -try to avoid foods/beverages  with added sugar or sweeteners/sweets  -try to avoid sweet drinks (including diet drinks): soda, juice, Gatorade, sweet tea, power drinks, diet drinks -try to avoid white rice, white bread, pasta (unless whole grain)  EXERCISE GUIDELINES FOR ADULTS: -if you wish to increase your physical activity, do so gradually and with the approval of your doctor -STOP and seek medical care immediately if you have any chest pain, chest discomfort or trouble breathing when starting or increasing exercise  -move and stretch your body, legs, feet and arms when sitting for  long periods -Physical activity guidelines for optimal health in adults: -get at least 150 minutes per week of moderate exercise (can talk, but not sing); this is about 20-30 minutes of sustained activity 5-7 days  per week or two 10-15 minute episodes of sustained activity 5-7 days per week -do some muscle building/resistance training/strength training at least 2 days per week  -balance exercises 3+ days per week:   Stand somewhere where you have something sturdy to hold onto if you lose balance    1) lift up on toes, then back down, start with 5x per day and work up to 20x   2) stand and lift one leg straight out to the side so that foot is a few inches of the floor, start with 5x each side and work up to 20x each side   3) stand on one foot, start with 5 seconds each side and work up to 20 seconds on each side  If you need ideas or help with getting more active:  -Silver sneakers https://tools.silversneakers.com  -Walk with a Doc: Http://www.duncan-williams.com/  -try to include resistance (weight lifting/strength building) and balance exercises twice per week: or the following link for ideas: http://castillo-powell.com/  buyducts.dk  STRESS MANAGEMENT: -can try meditating, or just sitting quietly with deep breathing while intentionally relaxing all parts of your body for 5 minutes daily -if you need further help with stress, anxiety or depression please follow up with your primary doctor or contact the wonderful folks at [facility]: [phone]  SOCIAL CONNECTIONS: -options in [place] if you wish to engage in more social and exercise related activities:  -Silver sneakers https://tools.silversneakers.com  -Walk with a Doc: Http://www.duncan-williams.com/  -Check out the [facility] Active Adults 50+ section on the [place] of [employer] companies (hiking clubs, book clubs, cards and games, chess, exercise classes, aquatic classes and much more) - see the website for details: https://www.[place].gov/departments/parks-recreation/active-adults50  -YouTube has lots of exercise videos for different  ages and abilities as well  -[facility] (a variety of indoor and outdoor inperson activities for adults). [phone]. [address]  -Virtual Online Classes (a variety of topics): see seniorplanet.org or call [phone]  -consider volunteering at a school, hospice center, church, senior center or elsewhere

## 2023-12-09 NOTE — Progress Notes (Signed)
 ----------------------------------------------------------------------------------------------------------------------------------------------------------------------------------------------------------------------  Because this visit was a virtual/telehealth visit, some criteria may be missing or patient reported. Any vitals not documented were not able to be obtained and vitals that have been documented are patient reported.    MEDICARE ANNUAL PREVENTIVE VISIT WITH PROVIDER: (Welcome to Medicare, initial annual wellness or annual wellness exam)  Virtual Visit via Video Note  I connected with Morgan Maldonado on 12/09/23 by a video enabled telemedicine application and verified that I am speaking with the correct person using two identifiers.  Location patient: home Location provider:work or home office Persons participating in the virtual visit: patient, provider  Concerns and/or follow up today: detailed intake and risks/health assessment completed in flowsheets and below - please see for details. BP has been running a little up and will be seeing her doctor for this at the end of the month (120-150/70-80). No CP, SOB, HA, vision changes.   How often do you have a drink containing alcohol?n How many drinks containing alcohol do you have on a typical day when you are drinking?na How often do you have six or more drinks on one occasion?na Have you ever smoked?n Quit date if applicable? na  How many packs a day do/did you smoke? na Do you use smokeless tobacco?na Do you use an illicit drugs?na Do you feel safe at home?y Last dentist visit?goes on a regular basis Last eye Exam and location?needs to do - reports has not done in a few years   See HM section in Epic for other details of completed HM.    ROS: negative for report of fevers, unintentional weight loss, vision changes, vision loss, hearing loss or change, chest pain, sob, hemoptysis, melena, hematochezia, hematuria or bleeding  or bruising  Patient-completed extensive health risk assessment - reviewed and discussed with the patient: See Health Risk Assessment completed with patient prior to the visit either above or in recent phone note. This was reviewed in detailed with the patient today and appropriate recommendations, orders and referrals were placed as needed per Summary below and patient instructions.   Review of Medical History: -PMH, PSH, Family History and current specialty and care providers reviewed and updated and listed below   Patient Care Team: Panosh, Apolinar POUR, MD as PCP - General Rutherford Gain, MD as Attending Physician (Obstetrics and Gynecology) Kristie Lamprey, MD as Attending Physician (Gastroenterology)   Past Medical History:  Diagnosis Date   Chicken pox    as a child   Hypertension     Past Surgical History:  Procedure Laterality Date   bx right breast benign fibroadenoma     miscarriages     1998-1999 tubal pg   right tube and ovary     remove tubal pregnancy 1999    Social History   Socioeconomic History   Marital status: Married    Spouse name: Not on file   Number of children: 1   Years of education: 15   Highest education level: Not on file  Occupational History   Occupation: Copywriter, Advertising: [employer]  Tobacco Use   Smoking status: Former    Types: Cigarettes   Smokeless tobacco: Never  Substance and Sexual Activity   Alcohol use: No   Drug use: No   Sexual activity: Not on file  Other Topics Concern   Not on file  Social History Narrative   Married self employed real estate   About  50 minutes.   College degree   hh of 2  No pets             Social Drivers of Corporate Investment Banker Strain: Not on file  Food Insecurity: Not on file  Transportation Needs: Not on file  Physical Activity: Insufficiently Active (12/09/2023)   Exercise Vital Sign    Days of Exercise per Week: 3 days    Minutes of Exercise per Session: 30 min  Stress: No  Stress Concern Present (12/09/2023)   [name] of Occupational Health - Occupational Stress Questionnaire    Feeling of Stress: Not at all  Social Connections: Socially Integrated (12/09/2023)   Social Connection and Isolation Panel    Frequency of Communication with Friends and Family: Twice a week    Frequency of Social Gatherings with Friends and Family: Three times a week    Attends Religious Services: More than 4 times per year    Active Member of Clubs or Organizations: Yes    Attends Engineer, Structural: More than 4 times per year    Marital Status: Married  Catering Manager Violence: Not on file    Family History  Problem Relation Age of Onset   Cancer Mother    Hypertension Mother    Diabetes Mother    Diabetes Father    Gout Sister    Healthy Brother     Current Outpatient Medications on File Prior to Visit  Medication Sig Dispense Refill   VITAMIN D PO Take by mouth once a week.     aspirin  EC 81 MG EC tablet Take 1 tablet (81 mg total) by mouth daily. Swallow whole. 30 tablet 11   triamterene -hydrochlorothiazide  (MAXZIDE -25) 37.5-25 MG tablet Take 1 tablet by mouth daily. 90 tablet 3   No current facility-administered medications on file prior to visit.    Allergies  Allergen Reactions   Codeine Nausea And Vomiting   Hydrocodone Nausea And Vomiting   Penicillins    Lisinopril  Cough    Dry cough  With med.    Lotrel [Amlodipine  Besy-Benazepril  Hcl] Nausea Only    Took for one week.       Physical Exam Vitals requested from patient and listed below if patient had equipment and was able to obtain at home for this virtual visit: There were no vitals filed for this visit. Estimated body mass index is 34.57 kg/m as calculated from the following:   Height as of 08/14/23: 5' 4 (1.626 m).   Weight as of 08/14/23: 201 lb 6.4 oz (91.4 kg).  EKG (optional): deferred due to virtual visit  GENERAL: alert, oriented, no acute distress detected, full  vision exam deferred due to pandemic and/or virtual encounter  HEENT: atraumatic, conjunttiva clear, no obvious abnormalities on inspection of external nose and ears  NECK: normal movements of the head and neck  LUNGS: on inspection no signs of respiratory distress, breathing rate appears normal, no obvious gross SOB, gasping or wheezing  CV: no obvious cyanosis  MS: moves all visible extremities without noticeable abnormality  PSYCH/NEURO: pleasant and cooperative, no obvious depression or anxiety, speech and thought processing grossly intact, Cognitive function grossly intact  Flowsheet Row Office Visit from 04/19/2020 in [facility]  PHQ-9 Total Score 2        12/09/2023   11:18 AM 08/14/2023   11:42 AM 04/19/2020    2:23 PM  Depression screen PHQ 2/9  Decreased Interest 0 0 0  Down, Depressed, Hopeless 0 0 1  PHQ - 2 Score 0  0 1  Altered sleeping   0  Tired, decreased energy   0  Change in appetite   0  Feeling bad or failure about yourself    1  Trouble concentrating   0  Moving slowly or fidgety/restless   0  Suicidal thoughts   0  PHQ-9 Score   2   Difficult doing work/chores   Not difficult at all     Data saved with a previous flowsheet row definition       04/15/2020    8:57 PM 04/16/2020    2:00 PM 12/09/2023   10:54 AM  Fall Risk  Falls in the past year?   0  Was there an injury with Fall?   0  Fall Risk Category Calculator   0  (RETIRED) Patient Fall Risk Level Low fall risk  Low fall risk    Patient at Risk for Falls Due to   No Fall Risks  Fall risk Follow up   Falls evaluation completed     Data saved with a previous flowsheet row definition     SUMMARY AND PLAN:  Encounter for Medicare annual wellness exam  Discussed applicable health maintenance/preventive health measures and advised and referred or ordered per patient preferences: -has cologuard at home - advised to complete and return -she declined mammogram,  she says she will do with Dr. Rutherford -she declined bone density -discussed vaccines due risks/recs - she is considering some of them and plans to get at the pharmacy Health Maintenance  Topic Date Due   Hepatitis C Screening  Never done   Zoster Vaccines- Shingrix (1 of 2) Never done   Fecal DNA (Cologuard)  Never done   Pneumococcal Vaccine: 50+ Years (1 of 1 - PCV) Never done   DTaP/Tdap/Td (2 - Tdap) 09/27/2018   COVID-19 Vaccine (3 - Moderna risk series) 06/12/2019   Bone Density Scan  Never done   Mammogram  06/01/2022   Influenza Vaccine  08/08/2023   Medicare Annual Wellness (AWV)  12/08/2024   Meningococcal B Vaccine  Aged Out   Colonoscopy  Discontinued      Education and counseling on the following was provided based on the above review of health and a plan/checklist for the patient, along with additional information discussed, was provided for the patient in the patient instructions :  -requested copy of advanced directives -Provided counseling and plan for increased risk of falling if applicable per above screening. Reviewed and demonstrated safe balance exercises that can be done at home to improve balance and discussed exercise guidelines for adults with include balance exercises at least 3 days per week.  -Advised and counseled on a healthy lifestyle - including the importance of a healthy diet, regular physical activity, social connections and stress management. -Reviewed patient's current diet. Advised and counseled on a whole foods based healthy diet. A summary of a healthy diet was provided in the Patient Instructions. Encouraged to cut down on ultraprocessed grains and sweets, discussed healthiest choices for fruit servings, increased protein and veggies and replacement options for sweets.  -reviewed patient's current physical activity level and discussed exercise guidelines for adults. Discussed community resources and ideas for safe exercise at home to assist in  meeting exercise guideline recommendations in a safe and healthy way.  -Advise yearly dental visits at minimum and regular eye exams   Follow up: see patient instructions     Patient Instructions  I really enjoyed getting to talk with you today! I am  available on Tuesdays and Thursdays for virtual visits if you have any questions or concerns, or if I can be of any further assistance.   CHECKLIST FROM ANNUAL WELLNESS VISIT:  -Follow up (please call to schedule if not scheduled after visit):   -yearly for annual wellness visit with primary care office  Here is a list of your preventive care/health maintenance measures and the plan for each if any are due:  PLAN For any measures below that may be due:    1. Please complete the cologuard test.   2. Get mammogram. Please send Dr. Charlett copy of report.    3. Can get vaccines at the pharmacy. Please let us  know if you if you do so that we can update your record.   Health Maintenance  Topic Date Due   Hepatitis C Screening  Never done   Zoster Vaccines- Shingrix (1 of 2) Never done   Fecal DNA (Cologuard)  Never done   Pneumococcal Vaccine: 50+ Years (1 of 1 - PCV) Never done   DTaP/Tdap/Td (2 - Tdap) 09/27/2018   COVID-19 Vaccine (3 - Moderna risk series) 06/12/2019   Bone Density Scan  Never done   Mammogram  06/01/2022   Influenza Vaccine  08/08/2023   Medicare Annual Wellness (AWV)  12/08/2024   Meningococcal B Vaccine  Aged Out   Colonoscopy  Discontinued    -See a dentist at least yearly  -Get your eyes checked and then per your eye specialist's recommendations  -Other issues addressed today:   -I have included below further information regarding a healthy whole foods based diet, physical activity guidelines for adults, stress management and opportunities for social connections. I hope you find this information useful.    -----------------------------------------------------------------------------------------------------------------------------------------------------------------------------------------------------------------------------------------------------------    NUTRITION: -eat real food: lots of colorful vegetables (half the plate) and fruits -5-7 servings of vegetables and fruits per day (fresh or steamed is best), exp. 2 servings of vegetables with lunch and dinner and 2 servings of fruit per day. Berries and greens such as kale and collards are great choices.  -consume on a regular basis:  fresh fruits, fresh veggies, fish, nuts, seeds, healthy oils (such as olive oil, avocado oil), whole grains (make sure for bread/pasta/crackers/etc., that the first ingredient on label contains the word whole), legumes. -can eat small amounts of dairy and lean meat (no larger than the palm of your hand), but avoid processed meats such as ham, bacon, lunch meat, etc. -drink water -try to avoid fast food and pre-packaged foods, processed meat, ultra processed foods/beverages (donuts, candy, etc.) -most experts advise limiting sodium to < 2300mg  per day, should limit further is any chronic conditions such as high blood pressure, heart disease, diabetes, etc. The American Heart Association advised that < 1500mg  is is ideal -try to avoid foods/beverages that contain any ingredients with names you do not recognize  -try to avoid foods/beverages  with added sugar or sweeteners/sweets  -try to avoid sweet drinks (including diet drinks): soda, juice, Gatorade, sweet tea, power drinks, diet drinks -try to avoid white rice, white bread, pasta (unless whole grain)  EXERCISE GUIDELINES FOR ADULTS: -if you wish to increase your physical activity, do so gradually and with the approval of your doctor -STOP and seek medical care immediately if you have any chest pain, chest discomfort or trouble breathing when starting or  increasing exercise  -move and stretch your body, legs, feet and arms when sitting for long periods -Physical activity guidelines for optimal health in adults: -  get at least 150 minutes per week of moderate exercise (can talk, but not sing); this is about 20-30 minutes of sustained activity 5-7 days per week or two 10-15 minute episodes of sustained activity 5-7 days per week -do some muscle building/resistance training/strength training at least 2 days per week  -balance exercises 3+ days per week:   Stand somewhere where you have something sturdy to hold onto if you lose balance    1) lift up on toes, then back down, start with 5x per day and work up to 20x   2) stand and lift one leg straight out to the side so that foot is a few inches of the floor, start with 5x each side and work up to 20x each side   3) stand on one foot, start with 5 seconds each side and work up to 20 seconds on each side  If you need ideas or help with getting more active:  -Silver sneakers https://tools.silversneakers.com  -Walk with a Doc: Http://www.duncan-williams.com/  -try to include resistance (weight lifting/strength building) and balance exercises twice per week: or the following link for ideas: http://castillo-powell.com/  buyducts.dk  STRESS MANAGEMENT: -can try meditating, or just sitting quietly with deep breathing while intentionally relaxing all parts of your body for 5 minutes daily -if you need further help with stress, anxiety or depression please follow up with your primary doctor or contact the wonderful folks at [facility]: [phone]  SOCIAL CONNECTIONS: -options in [place] if you wish to engage in more social and exercise related activities:  -Silver sneakers https://tools.silversneakers.com  -Walk with a Doc: Http://www.duncan-williams.com/  -Check out the [facility] Active Adults 50+  section on the [place] of [employer] (hiking clubs, book clubs, cards and games, chess, exercise classes, aquatic classes and much more) - see the website for details: https://www.[place]-.gov/departments/parks-recreation/active-adults50  -YouTube has lots of exercise videos for different ages and abilities as well  -[facility] (a variety of indoor and outdoor inperson activities for adults). (579)627-1715. [address]  -Virtual Online Classes (a variety of topics): see seniorplanet.org or call ([phone]) [phone]  -consider volunteering at a school, hospice center, church, senior center or elsewhere            Chiquita JONELLE Cramp, DO

## 2023-12-20 LAB — COLOGUARD: COLOGUARD: NEGATIVE

## 2024-01-05 ENCOUNTER — Other Ambulatory Visit (INDEPENDENT_AMBULATORY_CARE_PROVIDER_SITE_OTHER)

## 2024-01-05 DIAGNOSIS — E785 Hyperlipidemia, unspecified: Secondary | ICD-10-CM

## 2024-01-05 DIAGNOSIS — Z79899 Other long term (current) drug therapy: Secondary | ICD-10-CM

## 2024-01-05 LAB — HEMOGLOBIN A1C: Hgb A1c MFr Bld: 6.3 % (ref 4.6–6.5)

## 2024-01-05 LAB — LIPID PANEL
Cholesterol: 186 mg/dL (ref 28–200)
HDL: 43.3 mg/dL
LDL Cholesterol: 123 mg/dL — ABNORMAL HIGH (ref 10–99)
NonHDL: 142.41
Total CHOL/HDL Ratio: 4
Triglycerides: 98 mg/dL (ref 10.0–149.0)
VLDL: 19.6 mg/dL (ref 0.0–40.0)

## 2024-01-07 ENCOUNTER — Ambulatory Visit (INDEPENDENT_AMBULATORY_CARE_PROVIDER_SITE_OTHER): Admitting: Internal Medicine

## 2024-01-07 ENCOUNTER — Encounter: Payer: Self-pay | Admitting: Internal Medicine

## 2024-01-07 VITALS — BP 140/74 | HR 82 | Temp 97.7°F | Ht 64.0 in | Wt 200.6 lb

## 2024-01-07 DIAGNOSIS — Z79899 Other long term (current) drug therapy: Secondary | ICD-10-CM

## 2024-01-07 DIAGNOSIS — I10 Essential (primary) hypertension: Secondary | ICD-10-CM | POA: Diagnosis not present

## 2024-01-07 DIAGNOSIS — E785 Hyperlipidemia, unspecified: Secondary | ICD-10-CM | POA: Diagnosis not present

## 2024-01-07 DIAGNOSIS — Z23 Encounter for immunization: Secondary | ICD-10-CM | POA: Diagnosis not present

## 2024-01-07 NOTE — Patient Instructions (Addendum)
 Bp goal  is average  below 130/80  continue.   Cholesterol is better  ...  Plan 3-4 months  fu and then go from there.   Wt Readings from Last 3 Encounters:  01/07/24 200 lb 9.6 oz (91 kg)  08/14/23 201 lb 6.4 oz (91.4 kg)  04/19/20 193 lb 9.6 oz (87.8 kg)

## 2024-01-07 NOTE — Progress Notes (Signed)
 "  Chief Complaint  Patient presents with   Medical Management of Chronic Issues    HPI: Morgan Maldonado 68 y.o. come in for Chronic disease management  HT  : wants t avoid meds  hates them  but taking and  implementing lifes style   advice from nurses and  wellness provider have been helpful  and she is working on this to improve  bp and lipids.   Has had dizziness with med but lays down and gets better .  Bp improing but wondered if diastolic of 70 was too low?  No cp sob has plans for more activity and weight management  To have conference  will consider lfu vaccine  Taking a beet supplement  ( can't stand eating beets)  Aska bout other  supple will send in info  ROS: See pertinent positives and negatives per HPI.  Past Medical History:  Diagnosis Date   Chicken pox    as a child   Hypertension     Family History  Problem Relation Age of Onset   Cancer Mother    Hypertension Mother    Diabetes Mother    Diabetes Father    Gout Sister    Healthy Brother     Social History   Socioeconomic History   Marital status: Married    Spouse name: Not on file   Number of children: 1   Years of education: 15   Highest education level: Not on file  Occupational History   Occupation: Copywriter, Advertising: [employer]  Tobacco Use   Smoking status: Former    Types: Cigarettes   Smokeless tobacco: Never  Substance and Sexual Activity   Alcohol use: No   Drug use: No   Sexual activity: Not on file  Other Topics Concern   Not on file  Social History Narrative   Married self employed real estate   About  50 minutes.   College degree   hh of 2   No pets             Social Drivers of Health   Tobacco Use: Medium Risk (08/14/2023)   Patient History    Smoking Tobacco Use: Former    Smokeless Tobacco Use: Never    Passive Exposure: Not on file  Financial Resource Strain: Not on file  Food Insecurity: Not on file  Transportation Needs: Not on file  Physical Activity:  Insufficiently Active (12/09/2023)   Exercise Vital Sign    Days of Exercise per Week: 3 days    Minutes of Exercise per Session: 30 min  Stress: No Stress Concern Present (12/09/2023)   [name] of Occupational Health - Occupational Stress Questionnaire    Feeling of Stress: Not at all  Social Connections: Socially Integrated (12/09/2023)   Social Connection and Isolation Panel    Frequency of Communication with Friends and Family: Twice a week    Frequency of Social Gatherings with Friends and Family: Three times a week    Attends Religious Services: More than 4 times per year    Active Member of Clubs or Organizations: Yes    Attends Banker Meetings: More than 4 times per year    Marital Status: Married  Depression (PHQ2-9): Low Risk (12/09/2023)   Depression (PHQ2-9)    PHQ-2 Score: 0  Alcohol Screen: Not on file  Housing: Not on file  Utilities: Not on file  Health Literacy: Not on file    Outpatient Medications Prior  to Visit  Medication Sig Dispense Refill   aspirin  EC 81 MG EC tablet Take 1 tablet (81 mg total) by mouth daily. Swallow whole. 30 tablet 11   triamterene -hydrochlorothiazide  (MAXZIDE -25) 37.5-25 MG tablet Take 1 tablet by mouth daily. 90 tablet 3   VITAMIN D PO Take by mouth once a week.     No facility-administered medications prior to visit.     EXAM:  BP (!) 140/74 (BP Location: Left Arm, Patient Position: Sitting, Cuff Size: Large)   Pulse 82   Temp 97.7 F (36.5 C) (Oral)   Ht 5' 4 (1.626 m)   Wt 200 lb 9.6 oz (91 kg)   SpO2 99%   BMI 34.43 kg/m   Body mass index is 34.43 kg/m.  GENERAL: vitals reviewed and listed above, alert, oriented, appears well hydrated and in no acute distress HEENT: atraumatic, conjunctiva  clear, no obvious abnormalities on inspection of external nose and ears OP : no lesion edema or exudate  NECK: no obvious masses on inspection palpation  LUNGS: clear to auscultation bilaterally, no wheezes,  rales or rhonchi, good air movement CV: HRRR, no clubbing cyanosis or  peripheral edema nl cap refill  MS: moves all extremities without noticeable focal  abnormality PSYCH: pleasant and cooperative, no obvious depression or anxiety Lab Results  Component Value Date   WBC 4.2 10/01/2023   HGB 13.1 10/01/2023   HCT 40.2 10/01/2023   PLT 247.0 10/01/2023   GLUCOSE 91 10/01/2023   CHOL 186 01/05/2024   TRIG 98.0 01/05/2024   HDL 43.30 01/05/2024   LDLDIRECT 122.7 06/14/2011   LDLCALC 123 (H) 01/05/2024   ALT 23 10/01/2023   AST 21 10/01/2023   NA 139 10/01/2023   K 3.8 10/01/2023   CL 103 10/01/2023   CREATININE 0.91 10/01/2023   BUN 12 10/01/2023   CO2 29 10/01/2023   TSH 4.01 10/01/2023   HGBA1C 6.3 01/05/2024   BP Readings from Last 3 Encounters:  01/07/24 (!) 140/74  08/14/23 (!) 160/70  07/12/23 (!) 172/80    ASSESSMENT AND PLAN:  Discussed the following assessment and plan:  Primary hypertension  Hyperlipidemia, unspecified hyperlipidemia type  Medication management  Influenza vaccine needed - Plan: Flu vaccine HIGH DOSE PF(Fluzone Trivalent) Ldl not at goal  but so much better   163to 123  total 249 to 186  A1c acceptable at 6.3  Weight minimal wieght loss She feels well and trying to avoid  further medication . Explained the  rationale of bp control for avoidance of cv events etc. She doesn't want to take a statin  so continue lsi  Ok to continue 3-4 mor months of ls and med for bp control  disc adding meds  low dose if not controlled .  As she is reluctant  for meds .  Record review   counsel about  importance of ht control medication risk benefit  etc.  She has made major changes and  going right direction  Disc  flu vaccine today and prevnar in future 34 minutes total  -Patient advised to return or notify health care team  if  new concerns arise.  Patient Instructions  Bp goal  is average  below 130/80  continue.   Cholesterol is better  ...  Plan  3-4 months  fu and then go from there.   Wt Readings from Last 3 Encounters:  01/07/24 200 lb 9.6 oz (91 kg)  08/14/23 201 lb 6.4 oz (91.4 kg)  04/19/20 193  lb 9.6 oz (87.8 kg)    Rylynne Schicker K. Abeeha Twist M.D. "

## 2024-01-09 ENCOUNTER — Other Ambulatory Visit
